# Patient Record
Sex: Female | Born: 1950 | Hispanic: Yes | Marital: Married | Smoking: Never smoker
Health system: Southern US, Community
[De-identification: ages and names within clinical notes are randomized; demographics above are authoritative.]

## PROBLEM LIST (undated history)

## (undated) DIAGNOSIS — E039 Hypothyroidism, unspecified: Secondary | ICD-10-CM

## (undated) DIAGNOSIS — Z9889 Other specified postprocedural states: Secondary | ICD-10-CM

## (undated) DIAGNOSIS — R112 Nausea with vomiting, unspecified: Secondary | ICD-10-CM

## (undated) HISTORY — PX: HEMORROIDECTOMY: SUR656

## (undated) HISTORY — PX: DILATION AND CURETTAGE OF UTERUS: SHX78

## (undated) HISTORY — PX: LIGATION / DIVISION SAPHENOUS VEIN: SUR828

---
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
## 1986-08-09 HISTORY — PX: BUNIONECTOMY: SHX129

## 1999-10-26 ENCOUNTER — Ambulatory Visit (HOSPITAL_COMMUNITY): Admission: RE | Admit: 1999-10-26 | Discharge: 1999-10-26 | Payer: Self-pay | Admitting: Family Medicine

## 1999-10-26 ENCOUNTER — Encounter: Payer: Self-pay | Admitting: Family Medicine

## 1999-10-31 ENCOUNTER — Encounter: Payer: Self-pay | Admitting: Family Medicine

## 1999-10-31 ENCOUNTER — Ambulatory Visit (HOSPITAL_COMMUNITY): Admission: RE | Admit: 1999-10-31 | Discharge: 1999-10-31 | Payer: Self-pay | Admitting: Family Medicine

## 1999-11-12 ENCOUNTER — Other Ambulatory Visit: Admission: RE | Admit: 1999-11-12 | Discharge: 1999-11-12 | Payer: Self-pay | Admitting: Obstetrics and Gynecology

## 2001-01-23 ENCOUNTER — Other Ambulatory Visit: Admission: RE | Admit: 2001-01-23 | Discharge: 2001-01-23 | Payer: Self-pay | Admitting: Obstetrics and Gynecology

## 2002-02-27 ENCOUNTER — Other Ambulatory Visit: Admission: RE | Admit: 2002-02-27 | Discharge: 2002-02-27 | Payer: Self-pay | Admitting: Obstetrics and Gynecology

## 2003-01-09 ENCOUNTER — Other Ambulatory Visit: Admission: RE | Admit: 2003-01-09 | Discharge: 2003-01-09 | Payer: Self-pay | Admitting: Obstetrics and Gynecology

## 2003-03-28 ENCOUNTER — Encounter: Admission: RE | Admit: 2003-03-28 | Discharge: 2003-03-28 | Payer: Self-pay | Admitting: Gastroenterology

## 2003-03-28 ENCOUNTER — Encounter: Payer: Self-pay | Admitting: Gastroenterology

## 2003-07-31 ENCOUNTER — Ambulatory Visit (HOSPITAL_COMMUNITY): Admission: RE | Admit: 2003-07-31 | Discharge: 2003-07-31 | Payer: Self-pay | Admitting: Gastroenterology

## 2004-05-29 ENCOUNTER — Other Ambulatory Visit: Admission: RE | Admit: 2004-05-29 | Discharge: 2004-05-29 | Payer: Self-pay | Admitting: Obstetrics and Gynecology

## 2004-06-04 ENCOUNTER — Ambulatory Visit (HOSPITAL_COMMUNITY): Admission: RE | Admit: 2004-06-04 | Discharge: 2004-06-04 | Payer: Self-pay | Admitting: Obstetrics and Gynecology

## 2005-06-10 ENCOUNTER — Other Ambulatory Visit: Admission: RE | Admit: 2005-06-10 | Discharge: 2005-06-10 | Payer: Self-pay | Admitting: Obstetrics and Gynecology

## 2008-09-03 ENCOUNTER — Encounter (INDEPENDENT_AMBULATORY_CARE_PROVIDER_SITE_OTHER): Payer: Self-pay | Admitting: General Surgery

## 2008-09-03 ENCOUNTER — Ambulatory Visit (HOSPITAL_COMMUNITY): Admission: RE | Admit: 2008-09-03 | Discharge: 2008-09-03 | Payer: Self-pay | Admitting: General Surgery

## 2008-11-11 ENCOUNTER — Encounter: Admission: RE | Admit: 2008-11-11 | Discharge: 2008-11-11 | Payer: Self-pay | Admitting: Obstetrics and Gynecology

## 2009-04-23 ENCOUNTER — Encounter: Admission: RE | Admit: 2009-04-23 | Discharge: 2009-04-23 | Payer: Self-pay | Admitting: Family Medicine

## 2009-11-11 ENCOUNTER — Encounter: Admission: RE | Admit: 2009-11-11 | Discharge: 2009-11-11 | Payer: Self-pay | Admitting: Obstetrics and Gynecology

## 2010-04-16 ENCOUNTER — Encounter: Admission: RE | Admit: 2010-04-16 | Discharge: 2010-04-16 | Payer: Self-pay | Admitting: Family Medicine

## 2010-04-22 ENCOUNTER — Encounter: Admission: RE | Admit: 2010-04-22 | Discharge: 2010-04-22 | Payer: Self-pay | Admitting: Family Medicine

## 2010-04-23 ENCOUNTER — Encounter: Admission: RE | Admit: 2010-04-23 | Discharge: 2010-04-23 | Payer: Self-pay | Admitting: Family Medicine

## 2010-08-24 ENCOUNTER — Encounter
Admission: RE | Admit: 2010-08-24 | Discharge: 2010-08-24 | Payer: Self-pay | Source: Home / Self Care | Attending: Obstetrics and Gynecology | Admitting: Obstetrics and Gynecology

## 2010-08-29 ENCOUNTER — Encounter: Payer: Self-pay | Admitting: Obstetrics and Gynecology

## 2010-08-30 ENCOUNTER — Encounter: Payer: Self-pay | Admitting: Obstetrics and Gynecology

## 2010-09-29 ENCOUNTER — Encounter (HOSPITAL_COMMUNITY)
Admission: RE | Admit: 2010-09-29 | Discharge: 2010-09-29 | Disposition: A | Discharge status: Home / Self Care | Payer: BC Managed Care – PPO | Source: Ambulatory Visit | Attending: Obstetrics and Gynecology | Admitting: Obstetrics and Gynecology

## 2010-09-29 DIAGNOSIS — Z01810 Encounter for preprocedural cardiovascular examination: Secondary | ICD-10-CM | POA: Insufficient documentation

## 2010-09-29 DIAGNOSIS — Z01812 Encounter for preprocedural laboratory examination: Secondary | ICD-10-CM | POA: Insufficient documentation

## 2010-09-29 LAB — CBC
MCH: 29.8 pg (ref 26.0–34.0)
MCHC: 34.4 g/dL (ref 30.0–36.0)
MCV: 86.7 fL (ref 78.0–100.0)
Platelets: 230 10*3/uL (ref 150–400)
RBC: 4.73 MIL/uL (ref 3.87–5.11)
RDW: 12.8 % (ref 11.5–15.5)
WBC: 7.6 10*3/uL (ref 4.0–10.5)

## 2010-10-02 ENCOUNTER — Other Ambulatory Visit: Payer: Self-pay | Admitting: Obstetrics and Gynecology

## 2010-10-02 ENCOUNTER — Ambulatory Visit (HOSPITAL_COMMUNITY)
Admission: RE | Admit: 2010-10-02 | Discharge: 2010-10-02 | Disposition: A | Discharge status: Home / Self Care | Payer: BC Managed Care – PPO | Source: Ambulatory Visit | Attending: Obstetrics and Gynecology | Admitting: Obstetrics and Gynecology

## 2010-10-02 DIAGNOSIS — N840 Polyp of corpus uteri: Secondary | ICD-10-CM | POA: Insufficient documentation

## 2010-10-02 DIAGNOSIS — N950 Postmenopausal bleeding: Secondary | ICD-10-CM | POA: Insufficient documentation

## 2010-10-02 LAB — PREGNANCY, URINE: Preg Test, Ur: NEGATIVE

## 2010-10-03 NOTE — Op Note (Signed)
  NAMEMICHAELANN, [name]              ACCOUNT NO.:  [id number]  MEDICAL RECORD NO.:  [id number]           PATIENT TYPE:  O  LOCATION:  [facility]                          [facility]  PHYSICIAN:  Duke Salvia. Marcelle Dudley, M.D.DATE OF BIRTH:  Feb 19, 1951  DATE OF PROCEDURE:  10/02/2010 DATE OF DISCHARGE:                              OPERATIVE REPORT   PREOPERATIVE DIAGNOSIS:  Leiomyoma, postmenopausal bleeding.  POSTOPERATIVE DIAGNOSIS:  Leiomyoma, postmenopausal bleeding.  PROCEDURE:  D and C hysteroscopy.  SURGEON:  Duke Salvia. Marcelle Overlie, MD  ANESTHESIA:  General.  COMPLICATIONS:  None.  DRAINS:  In-and-out Foley catheter.  BLOOD LOSS:  Minimum.  SPECIMENS REMOVED:  Endometrial curettings.  PROCEDURE AND FINDINGS:  The patient was taken to the operating room after an adequate level of general endotracheal anesthesia was obtained. With the patient's legs in stirrups, the perineum and vagina prepped and draped in usual manner for D and C.  The bladder was drained and EUA carried out.  Uterus was 14-week size.  Adnexa negative.  Speculum was positioned.  Cervix was grasped with tenaculum, was sounded to 10 cm, progressively dilated to a 27 Pratt dilator.  The continuous flow hysteroscope was inserted.  The endometrium was atrophic appearing. There were three submucous fibroids noted; one on the right lower wall, one on the left lower wall and a third at the right fundus.  There was no other atypical-appearing endometrium.  Sharp curettage was carried out along with endometrial Pipelle biopsy, minimal tissue was obtained. Portions of one fibroid were avulsed and trying to use the polyp forceps, this was sent with endometrial biopsy also.  Minimal bleeding. She tolerated this well, went to recovery room in good condition.     Allison Dudley, M.D.    RMH/MEDQ  D:  10/02/2010  T:  10/02/2010  Job:  [id number]  Electronically Signed by Richarda Dudley M.D. on 10/03/2010 04:19:46 PM

## 2010-10-03 NOTE — H&P (Signed)
  Allison Dudley, Allison Dudley              ACCOUNT NO.:  [id number]  MEDICAL RECORD NO.:  [id number]           PATIENT TYPE:  O  LOCATION:  [facility]                          [facility]  PHYSICIAN:  Duke Salvia. Marcelle Overlie, M.D.DATE OF BIRTH:  02/11/1951  DATE OF ADMISSION:  10/02/2010 DATE OF DISCHARGE:                             HISTORY & PHYSICAL   CHIEF COMPLAINT:  Postmenopausal bleeding, leiomyoma.  HISTORY OF PRESENT ILLNESS:  A 60 year old G2, P2, postmenopausal patient, not currently on HRT.  She has a long history of significant leiomyoma that has not been a problem for her in the past.  Had experienced some light postmenopausal bleeding.  An attempt made to do endometrial biopsy in the office, very difficult due to the irregular configuration of her uterus from fibroids and cervical stenosis.  She presents now for D and C hysteroscopy.  This procedure including risks related to bleeding, infection, uterine perforation that may require additional surgery all discussed with her which she understands and accepts.  She did have ultrasound done in [facility] recently, unable to evaluate the endometrium due to the large size of the leiomyoma.  PAST MEDICAL HISTORY:  Allergies:  None.  CURRENT MEDICATIONS:  Armour Thyroid.  REVIEW OF SYSTEMS:  Significant for thyroid disease.  FAMILY HISTORY:  Significant for breast cancer, hypertension, phlebitis.  SOCIAL HISTORY:  Denies alcohol, tobacco, or drug use.  Windle Guard, MD is her medical doctor.  Pap on November 05, 2009, was normal.  PHYSICAL EXAMINATION:  VITAL SIGNS:  Temperature 98.2, blood pressure 120/82. HEENT:  Unremarkable. NECK:  Supple without masses. LUNGS:  Clear. CARDIOVASCULAR:  Regular rate and rhythm without murmurs, rubs, or gallops. BREASTS:  Without masses. ABDOMEN:  Soft, flat, and nontender.  The uterus is palpable above the symphysis.  Vulva, vagina, and cervix normal.  Stenotic-appearing  cervix with some atrophic vaginal changes.  Uterus 14-week size, irregular. Adnexa negative.  IMPRESSION: 1. Leiomyoma. 2. Postmenopausal spotting.  PLAN:  D and C hysteroscopy.  Procedure and risks reviewed as above.     Valta Dillon M. Marcelle Overlie, M.D.     RMH/MEDQ  D:  10/02/2010  T:  10/02/2010  Job:  [id number]  Electronically Signed by Richarda Overlie M.D. on 10/03/2010 04:19:47 PM

## 2010-11-17 ENCOUNTER — Other Ambulatory Visit: Payer: Self-pay | Admitting: Family Medicine

## 2010-11-17 DIAGNOSIS — Z09 Encounter for follow-up examination after completed treatment for conditions other than malignant neoplasm: Secondary | ICD-10-CM

## 2010-11-23 LAB — DIFFERENTIAL
Basophils Absolute: 0.1 10*3/uL (ref 0.0–0.1)
Basophils Relative: 1 % (ref 0–1)
Eosinophils Absolute: 0.4 10*3/uL (ref 0.0–0.7)
Eosinophils Relative: 4 % (ref 0–5)
Lymphocytes Relative: 20 % (ref 12–46)
Monocytes Relative: 6 % (ref 3–12)

## 2010-11-23 LAB — COMPREHENSIVE METABOLIC PANEL
AST: 68 U/L — ABNORMAL HIGH (ref 0–37)
BUN: 9 mg/dL (ref 6–23)
CO2: 29 mEq/L (ref 19–32)
Calcium: 9.7 mg/dL (ref 8.4–10.5)
Chloride: 103 mEq/L (ref 96–112)
Glucose, Bld: 89 mg/dL (ref 70–99)
Potassium: 4.1 mEq/L (ref 3.5–5.1)
Sodium: 137 mEq/L (ref 135–145)

## 2010-11-23 LAB — CBC
RBC: 4.67 MIL/uL (ref 3.87–5.11)
RDW: 13.3 % (ref 11.5–15.5)

## 2010-12-15 ENCOUNTER — Ambulatory Visit
Admission: RE | Admit: 2010-12-15 | Discharge: 2010-12-15 | Disposition: A | Discharge status: Home / Self Care | Payer: BC Managed Care – PPO | Source: Ambulatory Visit | Attending: Family Medicine | Admitting: Family Medicine

## 2010-12-15 DIAGNOSIS — Z09 Encounter for follow-up examination after completed treatment for conditions other than malignant neoplasm: Secondary | ICD-10-CM

## 2010-12-22 NOTE — Op Note (Signed)
NAMEJOYICE, Allison Dudley              ACCOUNT NO.:  [id number]   MEDICAL RECORD NO.:  [id number]          PATIENT TYPE:  AMB   LOCATION:  [facility]                          FACILITY:  [facility]   PHYSICIAN:  Ollen Gross. Vernell Morgans, M.D. DATE OF BIRTH:  04-Jun-1951   DATE OF PROCEDURE:  09/03/2008  DATE OF DISCHARGE:  09/03/2008                               OPERATIVE REPORT   PREOPERATIVE DIAGNOSIS:  Internal and external hemorrhoids.   POSTOPERATIVE DIAGNOSIS:  Internal and external hemorrhoids.   PROCEDURE:  Internal and external hemorrhoidectomy with internal  hemorrhoid banding x2.   SURGEON:  Ollen Gross. Vernell Morgans, MD   ANESTHESIA:  General endotracheal.   PROCEDURE:  After informed consent was obtained, the patient was brought  to the operating room and placed in the supine position on operating  room table.  After adequate induction of general anesthesia, the patient  was placed in lithotomy position.  Her perirectal area was then prepped  with Betadine and draped in the usual sterile manner.  The patient's  perirectal area was then infiltrated with 0.25% Marcaine with  epinephrine and 1 mL of Wydase.  The patient was massaged gently for  several minutes.  A bullet-type retractor was then placed in the rectum  without difficulty.  The rectum was examined circumferentially.  In the  right posterior region, the patient had 1 column of significant internal  and external hemorrhoid tissue.  This was grasped with an Allis clamp  and elevated.  The hemorrhoid was scored near the base with a 15-blade  knife.  Hemostat was then placed across the base and the hemorrhoid was  excised on the top of the hemostat with tenotomy scissors.  This was  sent to pathology for further evaluation.  The incision was then closed  with a running 2-0 chromic stitch.  One figure-of-eight 2-0 chromic  stitch was used along the midportion of the suture line for hemostasis.  The last couple millimeters of the wound were left  open externally.  The  rest of the rectum was examined.  The patient had pretty minimal  external disease.  Otherwise, there were two areas, one anteriorly and  one on the left posterior position where the patient had some moderate  internal hemorrhoidal disease and each of these areas were banded  without difficulty.  Once this was accomplished, the area was completely  hemostatic and looked good.  The rectum was then filled with some  dibucaine ointment along with a small piece of Gelfoam and dibucaine  ointment was placed externally along with sterile dressings.  The  patient tolerated the procedure well.  At the end of the case, all  needle, sponge, and instrument counts were correct.  The patient was  then awakened and taken to the recovery room in stable condition.      Ollen Gross. Vernell Morgans, M.D.  Electronically Signed     PST/MEDQ  D:  09/03/2008  T:  09/03/2008  Job:  [id number]

## 2010-12-25 NOTE — Op Note (Signed)
Allison Dudley, Allison Dudley                        ACCOUNT NO.:  [id number]   MEDICAL RECORD NO.:  [id number]                   PATIENT TYPE:  AMB   LOCATION:  [facility]                                 FACILITY:  [facility]   PHYSICIAN:  Bernette Redbird, M.D.                DATE OF BIRTH:  Dec 08, 1950   DATE OF PROCEDURE:  07/31/2003  DATE OF DISCHARGE:                                 OPERATIVE REPORT   PROCEDURE PERFORMED:  Colonoscopy.   ENDOSCOPIST:  Florencia Reasons, M.D.   INDICATIONS FOR PROCEDURE:  Screening for colon cancer in a 60 year old  asymptomatic patient without risk factors.   FINDINGS:  Normal exam to the terminal ileum.   DESCRIPTION OF PROCEDURE:  The nature, purpose and risks of the procedure  had been discussed with the patient, who provided written consent.  Sedation  was fentanyl 90 mcg and Versed 9 mg IV without clinical instability.   The Olympus adjustable tension pediatric video colonoscope was quite easily  advanced around the colon to the terminal ileum which was entered for a  short distance and pullback was then performed.  The quality of the prep was  very good and it is felt that all areas were well seen.  This was a normal  examination.  No polyps, cancer, colitis, vascular malformations or  diverticulosis were noted.  Retroflexion in the rectum as well as  reinspection of the rectosigmoid was unremarkable.  No biopsies were  obtained.   The patient tolerated the procedure well and there were no apparent  complications.   IMPRESSION:  Normal screening colonoscopy.   PLAN:  Follow-up in five years for probable screening flexible  sigmoidoscopy.                                               Bernette Redbird, M.D.    RB/MEDQ  D:  07/31/2003  T:  08/01/2003  Job:  [id number]   cc:   Duke Salvia. Marcelle Overlie, M.D.  [address], [address] [place]  [place] [address]  Fax: [phone]

## 2011-09-03 ENCOUNTER — Other Ambulatory Visit: Payer: Self-pay | Admitting: Family Medicine

## 2011-09-03 DIAGNOSIS — R921 Mammographic calcification found on diagnostic imaging of breast: Secondary | ICD-10-CM

## 2011-09-28 ENCOUNTER — Ambulatory Visit
Admission: RE | Admit: 2011-09-28 | Discharge: 2011-09-28 | Disposition: A | Discharge status: Home / Self Care | Payer: BC Managed Care – PPO | Source: Ambulatory Visit | Attending: Family Medicine | Admitting: Family Medicine

## 2011-09-28 ENCOUNTER — Ambulatory Visit: Payer: BC Managed Care – PPO | Admitting: Physical Therapy

## 2011-09-28 DIAGNOSIS — R921 Mammographic calcification found on diagnostic imaging of breast: Secondary | ICD-10-CM

## 2011-09-29 ENCOUNTER — Ambulatory Visit: Payer: BC Managed Care – PPO | Admitting: Rehabilitative and Restorative Service Providers"

## 2011-09-30 ENCOUNTER — Ambulatory Visit: Payer: BC Managed Care – PPO | Admitting: Physical Therapy

## 2011-10-12 ENCOUNTER — Ambulatory Visit: Payer: BC Managed Care – PPO | Attending: Family Medicine | Admitting: Physical Therapy

## 2011-10-12 DIAGNOSIS — M6281 Muscle weakness (generalized): Secondary | ICD-10-CM | POA: Insufficient documentation

## 2011-10-12 DIAGNOSIS — M25669 Stiffness of unspecified knee, not elsewhere classified: Secondary | ICD-10-CM | POA: Insufficient documentation

## 2011-10-12 DIAGNOSIS — IMO0001 Reserved for inherently not codable concepts without codable children: Secondary | ICD-10-CM | POA: Insufficient documentation

## 2011-10-19 ENCOUNTER — Ambulatory Visit: Payer: BC Managed Care – PPO | Admitting: Rehabilitation

## 2011-10-26 ENCOUNTER — Ambulatory Visit: Payer: BC Managed Care – PPO | Admitting: Physical Therapy

## 2011-11-02 ENCOUNTER — Ambulatory Visit: Payer: BC Managed Care – PPO | Admitting: Physical Therapy

## 2011-11-09 ENCOUNTER — Encounter: Payer: BC Managed Care – PPO | Admitting: Physical Therapy

## 2011-11-16 ENCOUNTER — Encounter: Payer: BC Managed Care – PPO | Admitting: Physical Therapy

## 2011-11-23 ENCOUNTER — Ambulatory Visit: Payer: BC Managed Care – PPO | Attending: Family Medicine | Admitting: Physical Therapy

## 2011-11-23 DIAGNOSIS — M25669 Stiffness of unspecified knee, not elsewhere classified: Secondary | ICD-10-CM | POA: Insufficient documentation

## 2011-11-23 DIAGNOSIS — IMO0001 Reserved for inherently not codable concepts without codable children: Secondary | ICD-10-CM | POA: Insufficient documentation

## 2011-11-23 DIAGNOSIS — M6281 Muscle weakness (generalized): Secondary | ICD-10-CM | POA: Insufficient documentation

## 2011-11-30 ENCOUNTER — Ambulatory Visit: Payer: BC Managed Care – PPO | Admitting: Physical Therapy

## 2011-12-07 ENCOUNTER — Ambulatory Visit: Payer: BC Managed Care – PPO | Admitting: Physical Therapy

## 2011-12-14 ENCOUNTER — Encounter: Payer: BC Managed Care – PPO | Admitting: Physical Therapy

## 2011-12-14 IMAGING — MG MM DIGITAL SCREENING
4 series · 4 of 4 positions shown · non-contrast
Comparison: none

DG SCREEN MAMMOGRAM BILATERAL
Bilateral CC and MLO view(s) were taken.

DIGITAL SCREENING MAMMOGRAM WITH CAD:
The breast tissue is heterogeneously dense.  Microcalcifications are present in the left breast.  
Characterization with magnification views is recommended.  No mass or malignant type calcifications
are identified in the right breast.  Compared with prior studies.
Images were processed with CAD.

[R CC]
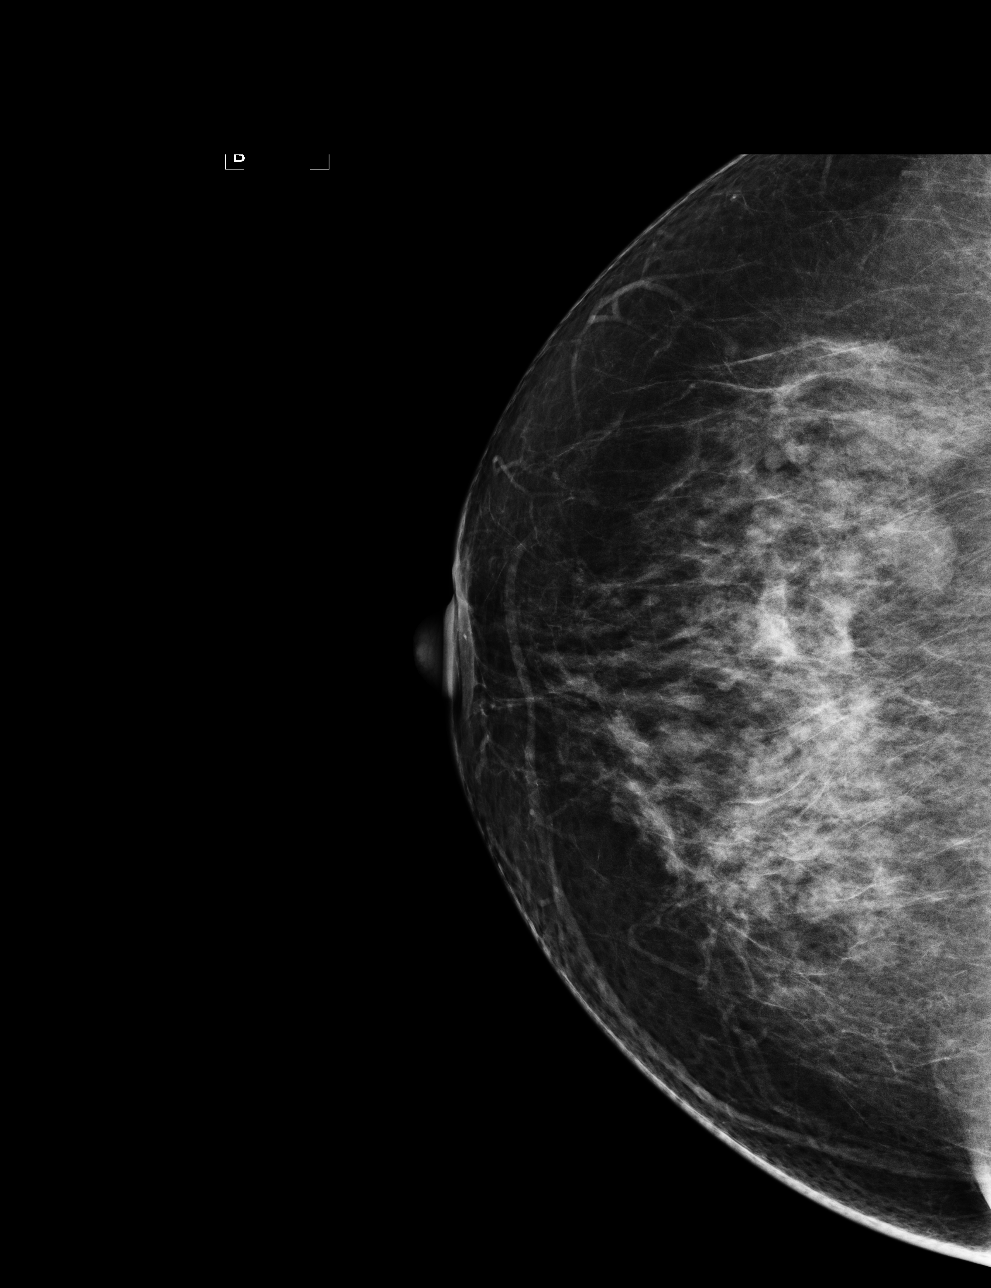

[L CC]
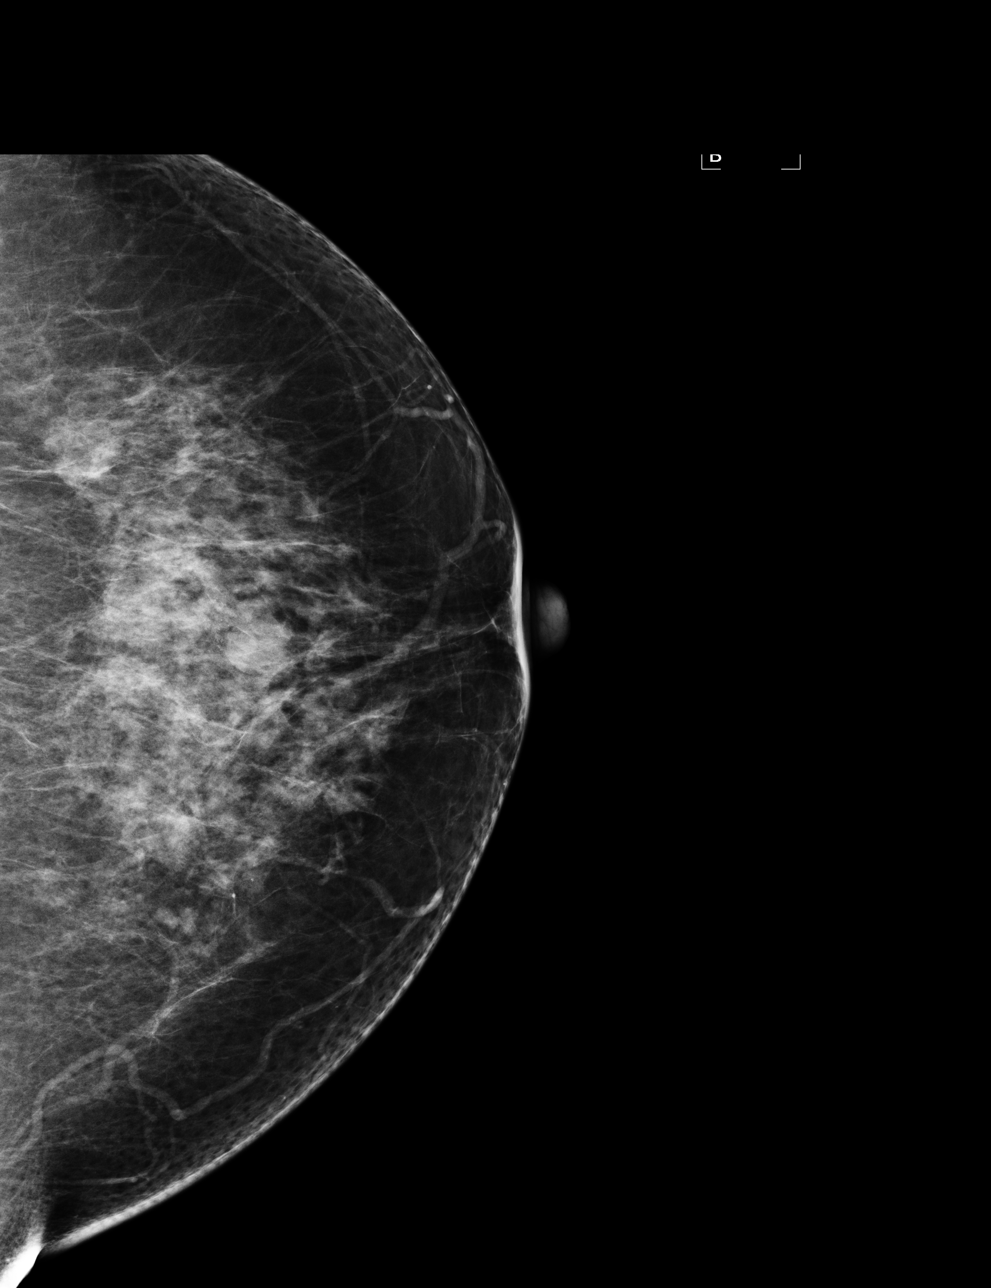

[L MLO]
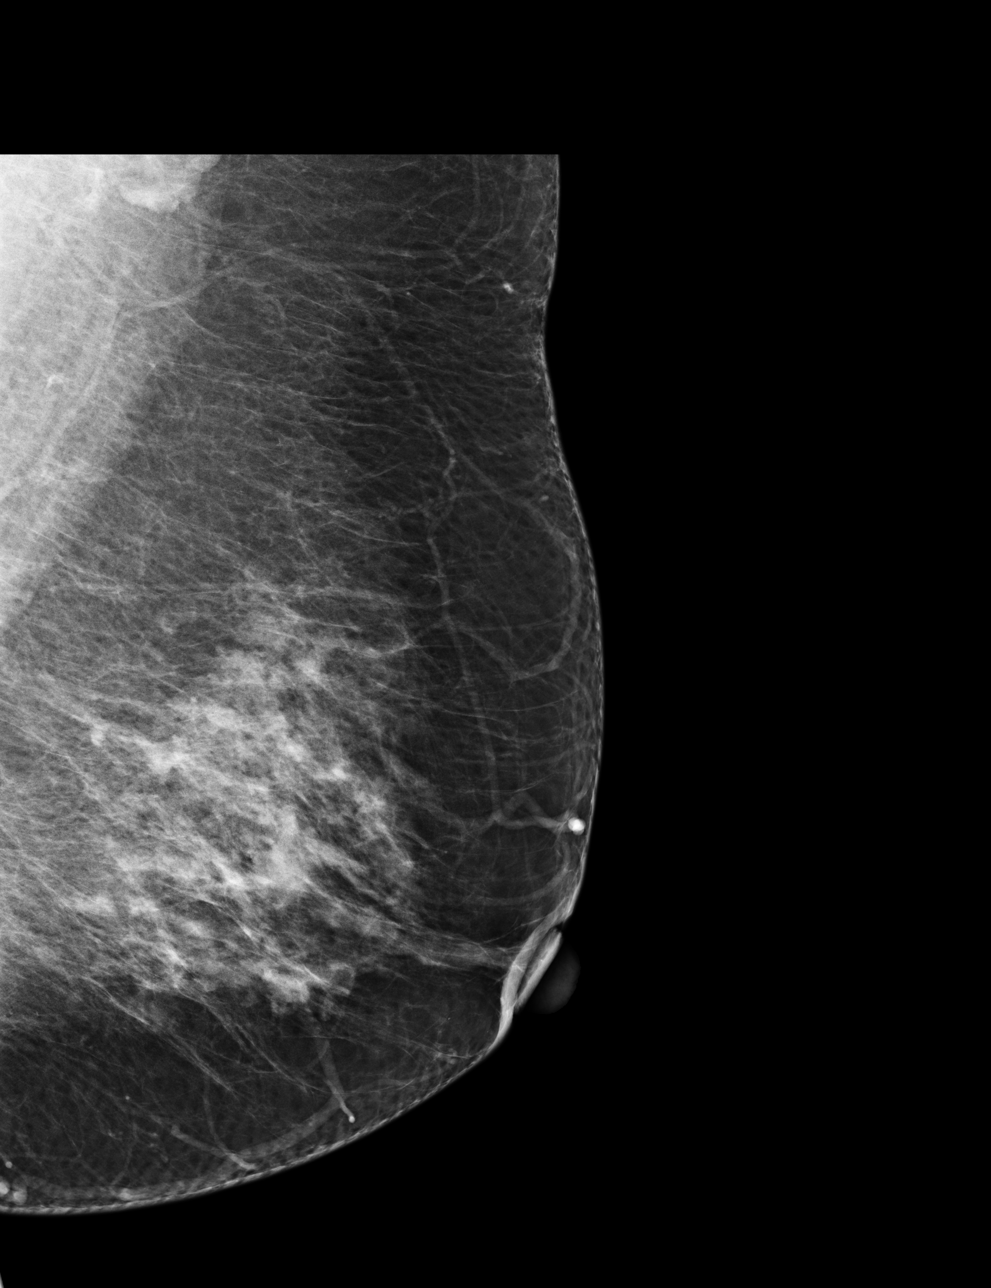

[R MLO]
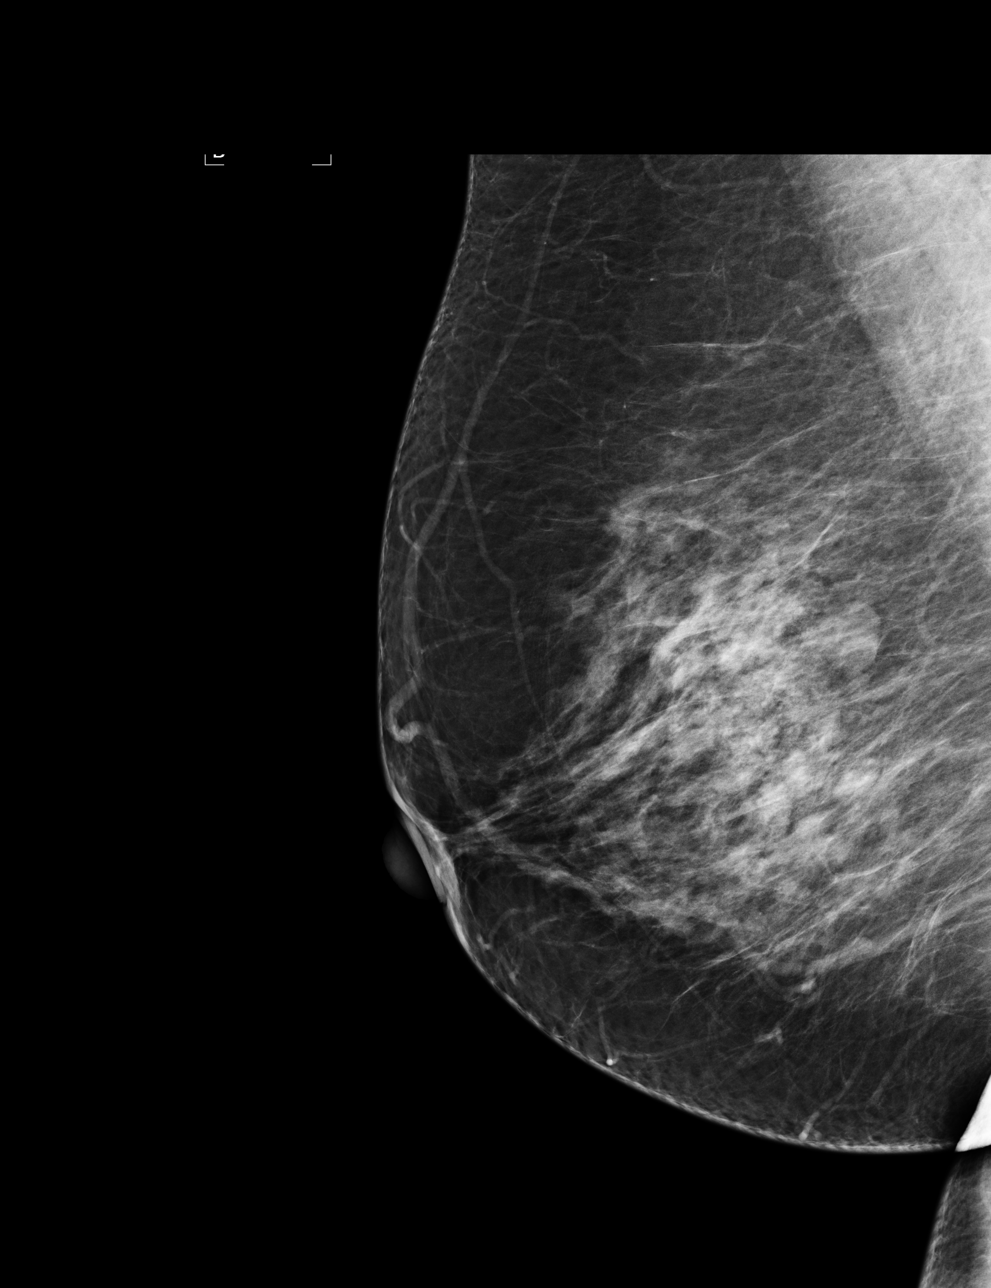

[4 of 4 positions shown; findings below may reference images not displayed]

IMPRESSION: Calcifications, left breast.  Additional evaluation is indicated. The patient will be contacted for
additional studies and a supplementary report will follow.  No specific mammographic evidence of 
malignancy, right breast.

ASSESSMENT: Need additional imaging evaluation and/or prior mammograms for comparison - BI-RADS 0

Further imaging of the left breast.
,

## 2012-01-30 ENCOUNTER — Encounter (HOSPITAL_COMMUNITY): Payer: Self-pay | Admitting: Pharmacist

## 2012-01-31 ENCOUNTER — Other Ambulatory Visit: Payer: Self-pay

## 2012-01-31 ENCOUNTER — Encounter (HOSPITAL_COMMUNITY)
Admission: RE | Admit: 2012-01-31 | Discharge: 2012-01-31 | Disposition: A | Discharge status: Home / Self Care | Payer: BC Managed Care – PPO | Source: Ambulatory Visit | Attending: Obstetrics and Gynecology | Admitting: Obstetrics and Gynecology

## 2012-01-31 ENCOUNTER — Encounter (HOSPITAL_COMMUNITY): Payer: Self-pay

## 2012-01-31 HISTORY — DX: Other specified postprocedural states: R11.2

## 2012-01-31 HISTORY — DX: Other specified postprocedural states: Z98.890

## 2012-01-31 HISTORY — DX: Hypothyroidism, unspecified: E03.9

## 2012-01-31 LAB — CBC
HCT: 41.4 % (ref 36.0–46.0)
Hemoglobin: 14.4 g/dL (ref 12.0–15.0)
MCH: 30.1 pg (ref 26.0–34.0)
MCV: 86.4 fL (ref 78.0–100.0)
Platelets: 244 10*3/uL (ref 150–400)
RBC: 4.79 MIL/uL (ref 3.87–5.11)
WBC: 8.6 10*3/uL (ref 4.0–10.5)

## 2012-01-31 LAB — SURGICAL PCR SCREEN: Staphylococcus aureus: NEGATIVE

## 2012-01-31 NOTE — H&P (Signed)
NAMETYTIONNA, CLOYD NO.:  [id number]  MEDICAL RECORD NO.:  [id number]  LOCATION:  [facility]                           [facility]  PHYSICIAN:  Duke Salvia. Marcelle Overlie, M.D.DATE OF BIRTH:  1950-08-28  DATE OF ADMISSION:  01/31/2012 DATE OF DISCHARGE:                             HISTORY & PHYSICAL   CHIEF COMPLAINT:  Pelvic pain secondary to the leiomyoma.  HISTORY OF PRESENT ILLNESS:  A 61 year old postmenopausal, G2, P2 patient who has a long history of leiomyoma.  Last year, she had some postmenopausal spotting and underwent D and C, hysteroscopy, that showed three submucous fibroids noted at that time of hysteroscopy, D and C. Specimen on October 02, 2010, showed endometrial polyps with no evidence of hyperplasia or malignancy.  She has had worsening problems with increased pain and pressure, and presents now for definitive TAHBSO.  Last ultrasound dated November 15, 2011, showed large fundal fibroid 14.3 x 9.8, second large one 10.5 x 8.6.  The procedure of TAHBSO discussed including risks related to bleeding, infection, transfusion, adjacent organ injury, wound infection, phlebitis along with her expected recovery time, which she understands and accepts.  PAST MEDICAL HISTORY:  ALLERGIES:  INHALANT ALLERGIES ONLY.  CURRENT MEDICATIONS:  Thyroid supplements, Armour Thyroid 60 mg per day.  REVIEW OF SYSTEMS:  Significant for thyroid disease.  FAMILY HISTORY:  Significant for hypertension and unspecified cancer. She has had two prior cesarean sections.  SOCIAL HISTORY:  Denies alcohol, tobacco, or drug use.  Dr. Jeannetta Nap is her medical doctor.  Last Pap dated January 20, 2012, was normal.  PHYSICAL EXAMINATION:  VITAL SIGNS:  Temp 98.2, blood pressure 130/78. HEENT:  Unremarkable. NECK:  Supple without masses. LUNGS:  Clear. CARDIOVASCULAR:  Regular rate and rhythm without murmurs, rubs, or gallops. BREASTS:  Without masses. ABDOMEN:  Soft, flat, nontender.   There are fibroids palpable below the umbilicus.  The vulva, vagina, cervix normal.  The uterus is approximately 15-16 weeks size. EXTREMITIES:  Unremarkable. NEUROLOGIC:  Unremarkable.  IMPRESSION:  Symptomatic leiomyoma, D and C, hysteroscopy last year with negative pathology.  PLAN:  TAHBSO procedure and risks discussed as above.     Brittnay Pigman M. Marcelle Overlie, M.D.     RMH/MEDQ  D:  01/31/2012  T:  01/31/2012  Job:  [id number]

## 2012-01-31 NOTE — Patient Instructions (Addendum)
YOUR PROCEDURE IS SCHEDULED ON:02/09/12  ENTER THROUGH THE MAIN ENTRANCE OF [facility] AT:0600 am USE DESK PHONE AND DIAL [phone] TO INFORM us OF YOUR ARRIVAL  CALL [phone] IF YOU HAVE ANY QUESTIONS OR PROBLEMS PRIOR TO YOUR ARRIVAL.  REMEMBER: DO NOT EAT OR DRINK AFTER MIDNIGHT : Tuesday   YOU MAY BRUSH YOUR TEETH THE MORNING OF SURGERY   TAKE THESE MEDICINES THE DAY OF SURGERY WITH SIP OF WATER: thyroid pill   DO NOT WEAR JEWELRY, EYE MAKEUP, LIPSTICK OR DARK FINGERNAIL POLISH DO NOT WEAR LOTIONS  DO NOT SHAVE FOR 48 HOURS PRIOR TO SURGERY  YOU WILL NOT BE ALLOWED TO DRIVE YOURSELF HOME.  NAME OF DRIVER: Earl Lites

## 2012-01-31 NOTE — Progress Notes (Signed)
Allison Dudley  DICTATION # [id number] CSN# [id number]   Meriel Pica, MD 01/31/2012 3:05 PM

## 2012-02-08 MED ORDER — CEFAZOLIN SODIUM-DEXTROSE 2-3 GM-% IV SOLR
2.0000 g | INTRAVENOUS | Status: AC
  Administered 2012-02-09: 2 g via INTRAVENOUS

## 2012-02-09 ENCOUNTER — Inpatient Hospital Stay (HOSPITAL_COMMUNITY)
Admission: RE | Admit: 2012-02-09 | Discharge: 2012-02-11 | DRG: 359 | Disposition: A | Discharge status: Home / Self Care | Payer: BC Managed Care – PPO | Source: Ambulatory Visit | Attending: Obstetrics and Gynecology | Admitting: Obstetrics and Gynecology

## 2012-02-09 ENCOUNTER — Encounter (HOSPITAL_COMMUNITY)
Admission: RE | Disposition: A | Discharge status: Home / Self Care | Payer: Self-pay | Source: Ambulatory Visit | Attending: Obstetrics and Gynecology

## 2012-02-09 ENCOUNTER — Encounter (HOSPITAL_COMMUNITY): Payer: Self-pay

## 2012-02-09 ENCOUNTER — Inpatient Hospital Stay (HOSPITAL_COMMUNITY): Payer: BC Managed Care – PPO

## 2012-02-09 DIAGNOSIS — N840 Polyp of corpus uteri: Secondary | ICD-10-CM | POA: Diagnosis present

## 2012-02-09 DIAGNOSIS — N8000 Endometriosis of the uterus, unspecified: Secondary | ICD-10-CM | POA: Diagnosis present

## 2012-02-09 DIAGNOSIS — D219 Benign neoplasm of connective and other soft tissue, unspecified: Secondary | ICD-10-CM

## 2012-02-09 DIAGNOSIS — D252 Subserosal leiomyoma of uterus: Principal | ICD-10-CM | POA: Diagnosis present

## 2012-02-09 HISTORY — PX: ABDOMINAL HYSTERECTOMY: SHX81

## 2012-02-09 HISTORY — PX: SALPINGOOPHORECTOMY: SHX82

## 2012-02-09 SURGERY — HYSTERECTOMY, ABDOMINAL
Anesthesia: General | Site: Abdomen | Wound class: Clean Contaminated

## 2012-02-09 MED ORDER — ATROPINE SULFATE 0.4 MG/ML IJ SOLN
INTRAMUSCULAR | Status: AC
  Filled 2012-02-09: qty 1

## 2012-02-09 MED ORDER — CEFAZOLIN SODIUM-DEXTROSE 2-3 GM-% IV SOLR
INTRAVENOUS | Status: AC
  Filled 2012-02-09: qty 50

## 2012-02-09 MED ORDER — MORPHINE SULFATE (PF) 1 MG/ML IV SOLN
INTRAVENOUS | Status: DC
  Administered 2012-02-09: 10.58 mg via INTRAVENOUS
  Administered 2012-02-09: 10 mg via INTRAVENOUS
  Administered 2012-02-09 (×2): via INTRAVENOUS
  Administered 2012-02-09: 5 mg via INTRAVENOUS
  Administered 2012-02-10: 2 mg via INTRAVENOUS
  Administered 2012-02-10: 13 mg via INTRAVENOUS
  Filled 2012-02-09 (×2): qty 25

## 2012-02-09 MED ORDER — PROPOFOL 10 MG/ML IV EMUL
INTRAVENOUS | Status: DC | PRN
  Administered 2012-02-09: 200 mg via INTRAVENOUS

## 2012-02-09 MED ORDER — DEXAMETHASONE SODIUM PHOSPHATE 10 MG/ML IJ SOLN
INTRAMUSCULAR | Status: AC
  Filled 2012-02-09: qty 1

## 2012-02-09 MED ORDER — LACTATED RINGERS IV SOLN
INTRAVENOUS | Status: DC
  Administered 2012-02-09 (×2): via INTRAVENOUS

## 2012-02-09 MED ORDER — KETOROLAC TROMETHAMINE 30 MG/ML IJ SOLN
30.0000 mg | Freq: Once | INTRAMUSCULAR | Status: DC
  Filled 2012-02-09 (×3): qty 1

## 2012-02-09 MED ORDER — OXYCODONE-ACETAMINOPHEN 5-325 MG PO TABS
1.0000 | ORAL_TABLET | ORAL | Status: DC | PRN
  Administered 2012-02-10 (×3): 2 via ORAL
  Administered 2012-02-11 (×2): 1 via ORAL
  Filled 2012-02-09 (×2): qty 2
  Filled 2012-02-09: qty 1
  Filled 2012-02-09: qty 2
  Filled 2012-02-09: qty 1

## 2012-02-09 MED ORDER — KETOROLAC TROMETHAMINE 30 MG/ML IJ SOLN: 30.0000 mg | Freq: Four times a day (QID) | INTRAMUSCULAR | Status: DC

## 2012-02-09 MED ORDER — BUPIVACAINE HCL (PF) 0.5 % IJ SOLN
INTRAMUSCULAR | Status: AC
  Filled 2012-02-09: qty 60

## 2012-02-09 MED ORDER — SODIUM CHLORIDE 0.9 % IJ SOLN: 9.0000 mL | INTRAMUSCULAR | Status: DC | PRN

## 2012-02-09 MED ORDER — KETOROLAC TROMETHAMINE 30 MG/ML IJ SOLN
30.0000 mg | Freq: Four times a day (QID) | INTRAMUSCULAR | Status: DC
Start: 2012-02-09 — End: 2012-02-10
  Administered 2012-02-09 – 2012-02-10 (×4): 30 mg via INTRAVENOUS
  Filled 2012-02-09 (×2): qty 1

## 2012-02-09 MED ORDER — SCOPOLAMINE 1 MG/3DAYS TD PT72
1.0000 | MEDICATED_PATCH | TRANSDERMAL | Status: DC
  Administered 2012-02-09: 1.5 mg via TRANSDERMAL

## 2012-02-09 MED ORDER — ROCURONIUM BROMIDE 50 MG/5ML IV SOLN
INTRAVENOUS | Status: AC
  Filled 2012-02-09: qty 1

## 2012-02-09 MED ORDER — BUTORPHANOL TARTRATE 2 MG/ML IJ SOLN: 1.0000 mg | INTRAMUSCULAR | Status: DC | PRN

## 2012-02-09 MED ORDER — LIDOCAINE HCL (CARDIAC) 20 MG/ML IV SOLN
INTRAVENOUS | Status: DC | PRN
  Administered 2012-02-09: 30 mg via INTRAVENOUS

## 2012-02-09 MED ORDER — ONDANSETRON HCL 4 MG/2ML IJ SOLN
INTRAMUSCULAR | Status: AC
  Filled 2012-02-09: qty 2

## 2012-02-09 MED ORDER — MIDAZOLAM HCL 2 MG/2ML IJ SOLN
INTRAMUSCULAR | Status: AC
  Filled 2012-02-09: qty 2

## 2012-02-09 MED ORDER — BUPIVACAINE HCL (PF) 0.5 % IJ SOLN
INTRAMUSCULAR | Status: AC
Start: 2012-02-09 — End: 2012-02-09
  Filled 2012-02-09: qty 60

## 2012-02-09 MED ORDER — MIDAZOLAM HCL 5 MG/5ML IJ SOLN
INTRAMUSCULAR | Status: DC | PRN
  Administered 2012-02-09: 2 mg via INTRAVENOUS

## 2012-02-09 MED ORDER — HYDROMORPHONE HCL PF 1 MG/ML IJ SOLN
0.2500 mg | INTRAMUSCULAR | Status: DC | PRN
  Administered 2012-02-09 (×2): 0.5 mg via INTRAVENOUS

## 2012-02-09 MED ORDER — MENTHOL 3 MG MT LOZG: 1.0000 | LOZENGE | OROMUCOSAL | Status: DC | PRN

## 2012-02-09 MED ORDER — HYDROMORPHONE HCL PF 1 MG/ML IJ SOLN
INTRAMUSCULAR | Status: AC
  Administered 2012-02-09: 0.5 mg via INTRAVENOUS
  Filled 2012-02-09: qty 1

## 2012-02-09 MED ORDER — NALOXONE HCL 0.4 MG/ML IJ SOLN: 0.4000 mg | INTRAMUSCULAR | Status: DC | PRN

## 2012-02-09 MED ORDER — NEOSTIGMINE METHYLSULFATE 1 MG/ML IJ SOLN
INTRAMUSCULAR | Status: DC | PRN
  Administered 2012-02-09: 5 mg via INTRAVENOUS

## 2012-02-09 MED ORDER — KETOROLAC TROMETHAMINE 30 MG/ML IJ SOLN
INTRAMUSCULAR | Status: AC
  Filled 2012-02-09: qty 1

## 2012-02-09 MED ORDER — LIDOCAINE HCL (CARDIAC) 20 MG/ML IV SOLN
INTRAVENOUS | Status: AC
  Filled 2012-02-09: qty 5

## 2012-02-09 MED ORDER — ZOLPIDEM TARTRATE 5 MG PO TABS: 5.0000 mg | ORAL_TABLET | Freq: Every evening | ORAL | Status: DC | PRN

## 2012-02-09 MED ORDER — EPHEDRINE 5 MG/ML INJ
INTRAVENOUS | Status: AC
  Filled 2012-02-09: qty 10

## 2012-02-09 MED ORDER — ONDANSETRON HCL 4 MG/2ML IJ SOLN: 4.0000 mg | Freq: Four times a day (QID) | INTRAMUSCULAR | Status: DC | PRN

## 2012-02-09 MED ORDER — FENTANYL CITRATE 0.05 MG/ML IJ SOLN
INTRAMUSCULAR | Status: DC | PRN
  Administered 2012-02-09 (×2): 50 ug via INTRAVENOUS
  Administered 2012-02-09: 100 ug via INTRAVENOUS
  Administered 2012-02-09 (×3): 50 ug via INTRAVENOUS

## 2012-02-09 MED ORDER — DIPHENHYDRAMINE HCL 12.5 MG/5ML PO ELIX: 12.5000 mg | ORAL_SOLUTION | Freq: Four times a day (QID) | ORAL | Status: DC | PRN

## 2012-02-09 MED ORDER — PROPOFOL 10 MG/ML IV EMUL
INTRAVENOUS | Status: AC
  Filled 2012-02-09: qty 20

## 2012-02-09 MED ORDER — NEOSTIGMINE METHYLSULFATE 1 MG/ML IJ SOLN
INTRAMUSCULAR | Status: AC
  Filled 2012-02-09: qty 10

## 2012-02-09 MED ORDER — GLYCOPYRROLATE 0.2 MG/ML IJ SOLN
INTRAMUSCULAR | Status: AC
  Filled 2012-02-09: qty 3

## 2012-02-09 MED ORDER — FENTANYL CITRATE 0.05 MG/ML IJ SOLN
INTRAMUSCULAR | Status: AC
  Filled 2012-02-09: qty 5

## 2012-02-09 MED ORDER — ONDANSETRON HCL 4 MG PO TABS: 4.0000 mg | ORAL_TABLET | Freq: Four times a day (QID) | ORAL | Status: DC | PRN

## 2012-02-09 MED ORDER — ATROPINE SULFATE 0.4 MG/ML IJ SOLN
INTRAMUSCULAR | Status: DC | PRN
  Administered 2012-02-09: 0.2 mg via INTRAVENOUS

## 2012-02-09 MED ORDER — LIDOCAINE HCL 1 % IJ SOLN
INTRAMUSCULAR | Status: DC | PRN
  Administered 2012-02-09: 10 mL

## 2012-02-09 MED ORDER — IBUPROFEN 800 MG PO TABS
800.0000 mg | ORAL_TABLET | Freq: Three times a day (TID) | ORAL | Status: DC | PRN
  Administered 2012-02-10 – 2012-02-11 (×3): 800 mg via ORAL
  Filled 2012-02-09 (×4): qty 1

## 2012-02-09 MED ORDER — EPHEDRINE SULFATE 50 MG/ML IJ SOLN
INTRAMUSCULAR | Status: DC | PRN
  Administered 2012-02-09: 20 mg via INTRAVENOUS

## 2012-02-09 MED ORDER — METHYLENE BLUE 1 % INJ SOLN
INTRAMUSCULAR | Status: AC
  Filled 2012-02-09: qty 1

## 2012-02-09 MED ORDER — METOCLOPRAMIDE HCL 5 MG/ML IJ SOLN: 10.0000 mg | Freq: Once | INTRAMUSCULAR | Status: DC | PRN

## 2012-02-09 MED ORDER — KETOROLAC TROMETHAMINE 30 MG/ML IJ SOLN
INTRAMUSCULAR | Status: DC | PRN
  Administered 2012-02-09: 30 mg via INTRAVENOUS

## 2012-02-09 MED ORDER — ROCURONIUM BROMIDE 100 MG/10ML IV SOLN
INTRAVENOUS | Status: DC | PRN
  Administered 2012-02-09: 40 mg via INTRAVENOUS
  Administered 2012-02-09: 10 mg via INTRAVENOUS

## 2012-02-09 MED ORDER — DEXAMETHASONE SODIUM PHOSPHATE 4 MG/ML IJ SOLN
INTRAMUSCULAR | Status: DC | PRN
  Administered 2012-02-09: 10 mg via INTRAVENOUS

## 2012-02-09 MED ORDER — BUPIVACAINE HCL (PF) 0.5 % IJ SOLN
INTRAMUSCULAR | Status: DC | PRN
  Administered 2012-02-09: 270 mL

## 2012-02-09 MED ORDER — ONDANSETRON HCL 4 MG/2ML IJ SOLN
INTRAMUSCULAR | Status: DC | PRN
  Administered 2012-02-09: 4 mg via INTRAVENOUS

## 2012-02-09 MED ORDER — DIPHENHYDRAMINE HCL 50 MG/ML IJ SOLN: 12.5000 mg | Freq: Four times a day (QID) | INTRAMUSCULAR | Status: DC | PRN

## 2012-02-09 MED ORDER — GLYCOPYRROLATE 0.2 MG/ML IJ SOLN
INTRAMUSCULAR | Status: AC
  Filled 2012-02-09: qty 1

## 2012-02-09 MED ORDER — LACTATED RINGERS IV SOLN: INTRAVENOUS | Status: DC

## 2012-02-09 MED ORDER — SCOPOLAMINE 1 MG/3DAYS TD PT72
MEDICATED_PATCH | TRANSDERMAL | Status: AC
  Administered 2012-02-09: 1.5 mg via TRANSDERMAL
  Filled 2012-02-09: qty 1

## 2012-02-09 MED ORDER — FENTANYL CITRATE 0.05 MG/ML IJ SOLN
INTRAMUSCULAR | Status: AC
  Filled 2012-02-09: qty 2

## 2012-02-09 MED ORDER — BUPIVACAINE HCL (PF) 0.5 % IJ SOLN
INTRAMUSCULAR | Status: AC
  Filled 2012-02-09: qty 30

## 2012-02-09 MED ORDER — MEPERIDINE HCL 25 MG/ML IJ SOLN: 6.2500 mg | INTRAMUSCULAR | Status: DC | PRN

## 2012-02-09 MED ORDER — GLYCOPYRROLATE 0.2 MG/ML IJ SOLN
INTRAMUSCULAR | Status: DC | PRN
  Administered 2012-02-09 (×2): 0.2 mg via INTRAVENOUS
  Administered 2012-02-09: 1 mg via INTRAVENOUS

## 2012-02-09 SURGICAL SUPPLY — 41 items
BARRIER ADHS 3X4 INTERCEED (GAUZE/BANDAGES/DRESSINGS) IMPLANT
BRR ADH 4X3 ABS CNTRL BYND (GAUZE/BANDAGES/DRESSINGS)
CANISTER SUCTION 2500CC (MISCELLANEOUS) ×3 IMPLANT
CATH KIT ON Q 5IN DUAL SLV (PAIN MANAGEMENT) ×2 IMPLANT
CLOTH BEACON ORANGE TIMEOUT ST (SAFETY) ×3 IMPLANT
CONT PATH 16OZ SNAP LID 3702 (MISCELLANEOUS) ×3 IMPLANT
DECANTER SPIKE VIAL GLASS SM (MISCELLANEOUS) ×1 IMPLANT
DRESSING TELFA 8X3 (GAUZE/BANDAGES/DRESSINGS) ×1 IMPLANT
DRSG COVADERM 4X10 (GAUZE/BANDAGES/DRESSINGS) ×1 IMPLANT
DRSG TEGADERM 2.38X2.75 (GAUZE/BANDAGES/DRESSINGS) IMPLANT
ELECT LIGASURE SHORT 9 REUSE (ELECTRODE) ×1 IMPLANT
GLOVE BIO SURGEON STRL SZ7 (GLOVE) ×6 IMPLANT
GOWN PREVENTION PLUS LG XLONG (DISPOSABLE) ×9 IMPLANT
NDL HYPO 25X1 1.5 SAFETY (NEEDLE) IMPLANT
NEEDLE HYPO 25X1 1.5 SAFETY (NEEDLE) IMPLANT
NS IRRIG 1000ML POUR BTL (IV SOLUTION) ×3 IMPLANT
PACK ABDOMINAL GYN (CUSTOM PROCEDURE TRAY) ×3 IMPLANT
PAD OB MATERNITY 4.3X12.25 (PERSONAL CARE ITEMS) ×3 IMPLANT
PROTECTOR NERVE ULNAR (MISCELLANEOUS) ×3 IMPLANT
SPONGE GAUZE 4X4 12PLY (GAUZE/BANDAGES/DRESSINGS) ×1 IMPLANT
SPONGE LAP 18X18 X RAY DECT (DISPOSABLE) ×6 IMPLANT
STRIP CLOSURE SKIN 1/4X4 (GAUZE/BANDAGES/DRESSINGS) ×3 IMPLANT
SUT CHROMIC 3 0 SH 27 (SUTURE) IMPLANT
SUT MON AB 2-0 CT1 36 (SUTURE) ×3 IMPLANT
SUT MON AB 4-0 PS1 27 (SUTURE) ×3 IMPLANT
SUT PDS AB 0 CT1 27 (SUTURE) ×6 IMPLANT
SUT PDS AB 1 CT  36 (SUTURE)
SUT PDS AB 1 CT 36 (SUTURE) IMPLANT
SUT VIC AB 0 CT1 18XCR BRD8 (SUTURE) ×4 IMPLANT
SUT VIC AB 0 CT1 27 (SUTURE) ×3
SUT VIC AB 0 CT1 27XBRD ANBCTR (SUTURE) ×6 IMPLANT
SUT VIC AB 0 CT1 8-18 (SUTURE) ×6
SUT VIC AB 2-0 CT1 27 (SUTURE)
SUT VIC AB 2-0 CT1 TAPERPNT 27 (SUTURE) IMPLANT
SUT VIC AB 3-0 CT1 27 (SUTURE) ×3
SUT VIC AB 3-0 CT1 TAPERPNT 27 (SUTURE) ×4 IMPLANT
SUT VICRYL 0 TIES 12 18 (SUTURE) ×3 IMPLANT
SYR CONTROL 10ML LL (SYRINGE) IMPLANT
TOWEL OR 17X24 6PK STRL BLUE (TOWEL DISPOSABLE) ×6 IMPLANT
TRAY FOLEY CATH 14FR (SET/KITS/TRAYS/PACK) ×3 IMPLANT
WATER STERILE IRR 1000ML POUR (IV SOLUTION) ×3 IMPLANT

## 2012-02-09 NOTE — Transfer of Care (Signed)
Immediate Anesthesia Transfer of Care Note  Patient: Allison Dudley  Procedure(s) Performed: Procedure(s) (LRB): HYSTERECTOMY ABDOMINAL (N/A) SALPINGO OOPHERECTOMY (Bilateral)  Patient Location: PACU  Anesthesia Type: General  Level of Consciousness: awake, alert  and oriented  Airway & Oxygen Therapy: Patient Spontanous Breathing and Patient connected to nasal cannula oxygen  Post-op Assessment: Report given to PACU RN, Post -op Vital signs reviewed and stable and Patient moving all extremities X 4  Post vital signs: Reviewed and stable  Complications: No apparent anesthesia complications

## 2012-02-09 NOTE — Progress Notes (Signed)
The patient was re-examined with no change in status 

## 2012-02-09 NOTE — Anesthesia Procedure Notes (Signed)
Procedure Name: Intubation Date/Time: 02/09/2012 7:35 AM Performed by: Lincoln Brigham Pre-anesthesia Checklist: Patient identified, Timeout performed, Emergency Drugs available, Suction available and Patient being monitored Patient Re-evaluated:Patient Re-evaluated prior to inductionOxygen Delivery Method: Circle system utilized Preoxygenation: Pre-oxygenation with 100% oxygen Intubation Type: IV induction Ventilation: Mask ventilation without difficulty Grade View: Grade IV Tube type: Oral Tube size: 7.0 mm Number of attempts: 4 Airway Equipment and Method: Patient positioned with wedge pillow,  Stylet and Video-laryngoscopy Placement Confirmation: ETT inserted through vocal cords under direct vision,  positive ETCO2 and breath sounds checked- equal and bilateral Secured at: 20 cm Tube secured with: Tape Dental Injury: Teeth and Oropharynx as per pre-operative assessment  Difficulty Due To: Difficulty was unanticipated and Difficult Airway- due to anterior larynx Future Recommendations: Recommend- induction with short-acting agent, and alternative techniques readily available Comments: Attempted Dl x 2 by CRNA No cords seen.  Glide Scope used by CRNA cords in visual field unable to pass ETT. Dr. Malen Gauze MDA attempted and passed ETT through cords via the Glide Scope.

## 2012-02-09 NOTE — Op Note (Signed)
Preoperative diagnosis: Symptomatic leiomyoma  Postoperative diagnosis: Same  Procedure: TAH BSO  Surgeon: Marcelle Overlie  assistant: Henderson Cloud  Anesthesia: Gen. endotracheal  EBL: 350 cc  Drains: Foley catheter  Specimens removed: Uterus, bilateral tubes and ovaries, to pathology   Procedure and findings:  The patient was taken the operating room after an adequate level of general endotracheal anesthesia was obtained with the patient supine the abdomen perineum and vagina were prepped with Betadine and a Foley catheter was positioned. Appropriate timeout for taken at that point.   Transverse Pfannenstiel incision was made through her old C-section scar carried down to the fascia which was incised and extended transversely. Rectus muscle divided in the midline peritoneum entered superiorly without incident and extended in a vertical fashion. The uterus approximately 16 weeks size was elevated through the incision, bilateral tubes and ovaries in the cul-de-sac free and clear there was moderate scarring anteriorly along the bladder flap starting on the left the round ligament was coagulated and divided the peritoneum carried around to the midline the exact same repeated on the opposite side, then with sharp and blunt dissection the bladder was dissected below. On the left the left IP ligament was isolated, course of the ureter was noted be well below, this was then coagulated and divided the exact same repeated on the opposite side, again after identifying the ureter well below. Once this was completed the uterine vasculature pedicles were skeletonized, coagulated and divided with the LigaSure device. After assuring that the bladder was well below staying close to the uterus the cardinal ligament was clamped divided and suture ligated along the uterosacral ligament and thigh the cervical-vaginal pedicles and the specimen was removed. Vaginal cuff was closed with several interrupted 2-0 Vicryl sutures. The  pelvis is in irrigated the operative site was inspected all pedicles and noted be hemostatic cecum and appendix were normal prior to closure sponge denies precast reported as correct x2 peritoneum closed with 2-0 Vicryl suture, 2-0 Vicryl interrupted sutures used to reapproximate the rectus muscles in the midline. 0 PDS suture was then used from laterally to midline on either side. Prior to closing the fascia a separate six-inch needle introducer was then used to introduce the subfascial On-Q catheter a separate incision made for the subcutaneous On-Q catheter. Once the fascia was closed the subcutaneous tissue was irrigated noted be hemostatic a 4-0 Monocryl subcuticular closure with a pressure dressing and the On-Q catheters PROM with 4-5 cc of 1% Xylocaine at each site. She tolerated this well, clear urine noted at the end of the case went to PACU in good condition  Dictated with [facility]  Allison Dudley M. Milana Obey.D.

## 2012-02-09 NOTE — Anesthesia Postprocedure Evaluation (Signed)
  Anesthesia Post-op Note  Patient: Allison Dudley  Procedure(s) Performed: Procedure(s) (LRB): HYSTERECTOMY ABDOMINAL (N/A) SALPINGO OOPHERECTOMY (Bilateral)  Patient Location: PACU  Anesthesia Type: General  Level of Consciousness: awake, alert  and oriented  Airway and Oxygen Therapy: Patient Spontanous Breathing  Post-op Pain: mild  Post-op Assessment: Post-op Vital signs reviewed, Patient's Cardiovascular Status Stable, Respiratory Function Stable, Patent Airway, No signs of Nausea or vomiting and Pain level controlled  Post-op Vital Signs: Reviewed and stable  Complications: No apparent anesthesia complications

## 2012-02-09 NOTE — Anesthesia Preprocedure Evaluation (Signed)
Anesthesia Evaluation  Patient identified by MRN, date of birth, ID band Patient awake    Reviewed: Allergy & Precautions, H&P , NPO status , Patient's Chart, lab work & pertinent test results  History of Anesthesia Complications (+) PONV  Airway Mallampati: III TM Distance: >3 FB Neck ROM: full    Dental  (+) Teeth Intact   Pulmonary neg pulmonary ROS,  breath sounds clear to auscultation        Cardiovascular negative cardio ROS  Rhythm:regular Rate:Normal     Neuro/Psych negative neurological ROS  negative psych ROS   GI/Hepatic negative GI ROS, Neg liver ROS,   Endo/Other  Hypothyroidism  Renal/GU negative Renal ROS  negative genitourinary   Musculoskeletal   Abdominal   Peds  Hematology negative hematology ROS (+)   Anesthesia Other Findings   Reproductive/Obstetrics negative OB ROS                           Anesthesia Physical Anesthesia Plan  ASA: II  Anesthesia Plan: General ETT   Post-op Pain Management:    Induction:   Airway Management Planned:   Additional Equipment:   Intra-op Plan:   Post-operative Plan:   Informed Consent: I have reviewed the patients History and Physical, chart, labs and discussed the procedure including the risks, benefits and alternatives for the proposed anesthesia with the patient or authorized representative who has indicated his/her understanding and acceptance.   Dental Advisory Given  Plan Discussed with: Anesthesiologist, CRNA and Surgeon  Anesthesia Plan Comments:         Anesthesia Quick Evaluation

## 2012-02-10 ENCOUNTER — Encounter (HOSPITAL_COMMUNITY): Payer: Self-pay | Admitting: Obstetrics and Gynecology

## 2012-02-10 LAB — CBC
HCT: 32.7 % — ABNORMAL LOW (ref 36.0–46.0)
Hemoglobin: 11.1 g/dL — ABNORMAL LOW (ref 12.0–15.0)
MCH: 29.8 pg (ref 26.0–34.0)
MCHC: 33.9 g/dL (ref 30.0–36.0)
MCV: 87.7 fL (ref 78.0–100.0)
RDW: 13.1 % (ref 11.5–15.5)

## 2012-02-10 NOTE — Progress Notes (Signed)
1 Day Post-Op Procedure(s) (LRB): HYSTERECTOMY ABDOMINAL (N/A) SALPINGO OOPHERECTOMY (Bilateral)  Subjective: Patient reports tolerating PO.    Objective: I have reviewed patient's vital signs, intake and output and labs.  Abd soft, + BS, Inc C/D CBC    Component Value Date/Time   WBC 17.5* 02/10/2012 0610   RBC 3.73* 02/10/2012 0610   HGB 11.1* 02/10/2012 0610   HCT 32.7* 02/10/2012 0610   PLT 231 02/10/2012 0610   MCV 87.7 02/10/2012 0610   MCH 29.8 02/10/2012 0610   MCHC 33.9 02/10/2012 0610   RDW 13.1 02/10/2012 0610   LYMPHSABS 2.3 08/29/2008 1144   MONOABS 0.7 08/29/2008 1144   EOSABS 0.4 08/29/2008 1144   BASOSABS 0.1 08/29/2008 1144      Assessment: s/p Procedure(s) (LRB): HYSTERECTOMY ABDOMINAL (N/A) SALPINGO OOPHERECTOMY (Bilateral): stable  Plan: Advance diet Encourage ambulation Discontinue IV fluids  LOS: 1 day    Allison Dudley M 02/10/2012, 9:15 AM

## 2012-02-10 NOTE — Addendum Note (Signed)
Addendum  created 02/10/12 1005 by Lincoln Brigham, CRNA   Modules edited:Notes Section

## 2012-02-10 NOTE — Anesthesia Postprocedure Evaluation (Signed)
  Anesthesia Post-op Note  Patient: Allison Dudley  Procedure(s) Performed: Procedure(s) (LRB): HYSTERECTOMY ABDOMINAL (N/A) SALPINGO OOPHERECTOMY (Bilateral)  Patient Location: [facility]  Anesthesia Type: General  Level of Consciousness: awake, alert  and oriented  Airway and Oxygen Therapy: Patient Spontanous Breathing  Post-op Pain: mild  Post-op Assessment: Patient's Cardiovascular Status Stable, Respiratory Function Stable, Patent Airway, No signs of Nausea or vomiting, Adequate PO intake and Pain level controlled  Post-op Vital Signs: stable  Complications: No apparent anesthesia complications

## 2012-02-10 NOTE — Progress Notes (Signed)
UR Chart review completed.  

## 2012-02-11 MED ORDER — IBUPROFEN 800 MG PO TABS: 800.0000 mg | ORAL_TABLET | Freq: Three times a day (TID) | ORAL | Status: AC | PRN

## 2012-02-11 MED ORDER — OXYCODONE-ACETAMINOPHEN 5-325 MG PO TABS: 1.0000 | ORAL_TABLET | ORAL | Status: AC | PRN

## 2012-02-11 NOTE — Progress Notes (Signed)
2 Days Post-Op Procedure(s) (LRB): HYSTERECTOMY ABDOMINAL (N/A) SALPINGO OOPHERECTOMY (Bilateral)  Subjective: Patient reports tolerating PO.    Objective: I have reviewed patient's vital signs, intake and output, medications and labs.  Inc C/D, some inferior bruising, ON Q cath out intact X 2  Assessment: s/p Procedure(s) (LRB): HYSTERECTOMY ABDOMINAL (N/A) SALPINGO OOPHERECTOMY (Bilateral): stable  Plan: Discharge home  LOS: 2 days    Aireal Slater M 02/11/2012, 8:07 AM

## 2012-02-11 NOTE — Discharge Summary (Signed)
Physician Discharge Summary  Patient ID: Allison Dudley MRN: [id number] DOB/AGE: 1950/12/05 61 y.o.  Admit date: 02/09/2012 Discharge date: 02/11/2012  Admission Diagnoses:  Discharge Diagnoses:  Active Problems:  Leiomyoma   Discharged Condition: good  Hospital Course: TAHBSO, diet adv POD#1, on POD #2, ON Q cath out X2, pt afeb , Inc C/D, tol PO and ready for D/C  Consults: None  Significant Diagnostic Studies: labs: CBC    Component Value Date/Time   WBC 17.5* 02/10/2012 0610   RBC 3.73* 02/10/2012 0610   HGB 11.1* 02/10/2012 0610   HCT 32.7* 02/10/2012 0610   PLT 231 02/10/2012 0610   MCV 87.7 02/10/2012 0610   MCH 29.8 02/10/2012 0610   MCHC 33.9 02/10/2012 0610   RDW 13.1 02/10/2012 0610   LYMPHSABS 2.3 08/29/2008 1144   MONOABS 0.7 08/29/2008 1144   EOSABS 0.4 08/29/2008 1144   BASOSABS 0.1 08/29/2008 1144      Treatments: surgery: TAHBSO  Discharge Exam: Blood pressure 122/74, pulse 68, temperature 98.2 F (36.8 C), temperature source Oral, resp. rate 18, height 5\' 5"  (1.651 m), weight 194 lb (87.998 kg), SpO2 95.00%. Abd soft + BS, Inc C/D  Disposition: 01-Home or Self Care   Medication List  As of 02/11/2012  8:10 AM   TAKE these medications         ibuprofen 800 MG tablet   Commonly known as: ADVIL,MOTRIN   Take 1 tablet (800 mg total) by mouth every 8 (eight) hours as needed (mild pain).      multivitamin with minerals Tabs   Take 1 tablet by mouth daily.      oxyCODONE-acetaminophen 5-325 MG per tablet   Commonly known as: PERCOCET   Take 1-2 tablets by mouth every 3 (three) hours as needed (moderate to severe pain (when tolerating fluids)).      thyroid 15 MG tablet   Commonly known as: ARMOUR   Take 15 mg by mouth 4 (four) times daily.           Follow-up Information    Follow up with Meriel Pica, MD. (office will call)    Contact information:   [address] [place] [address] [phone]           Signed: Meriel Pica 02/11/2012, 8:10 AM

## 2012-02-11 NOTE — Progress Notes (Signed)
Pt discharged home with husband... Condition stable... No equipment... Ambulated to car with E. Toms, RN 

## 2012-07-11 ENCOUNTER — Other Ambulatory Visit: Payer: Self-pay | Admitting: Family Medicine

## 2012-07-11 DIAGNOSIS — R921 Mammographic calcification found on diagnostic imaging of breast: Secondary | ICD-10-CM

## 2012-07-27 ENCOUNTER — Other Ambulatory Visit: Payer: Self-pay | Admitting: Family Medicine

## 2012-07-27 ENCOUNTER — Ambulatory Visit
Admission: RE | Admit: 2012-07-27 | Discharge: 2012-07-27 | Disposition: A | Discharge status: Home / Self Care | Payer: BC Managed Care – PPO | Source: Ambulatory Visit | Attending: Family Medicine | Admitting: Family Medicine

## 2012-07-27 DIAGNOSIS — R921 Mammographic calcification found on diagnostic imaging of breast: Secondary | ICD-10-CM

## 2012-08-08 ENCOUNTER — Other Ambulatory Visit (INDEPENDENT_AMBULATORY_CARE_PROVIDER_SITE_OTHER): Payer: Self-pay | Admitting: General Surgery

## 2012-08-08 ENCOUNTER — Ambulatory Visit (INDEPENDENT_AMBULATORY_CARE_PROVIDER_SITE_OTHER): Payer: BC Managed Care – PPO | Admitting: General Surgery

## 2012-08-08 ENCOUNTER — Encounter (INDEPENDENT_AMBULATORY_CARE_PROVIDER_SITE_OTHER): Payer: Self-pay | Admitting: General Surgery

## 2012-08-08 VITALS — BP 134/78 | HR 76 | Temp 97.9°F | Resp 18 | Ht 64.0 in | Wt 196.0 lb

## 2012-08-08 DIAGNOSIS — R921 Mammographic calcification found on diagnostic imaging of breast: Secondary | ICD-10-CM | POA: Insufficient documentation

## 2012-08-08 DIAGNOSIS — R928 Other abnormal and inconclusive findings on diagnostic imaging of breast: Secondary | ICD-10-CM

## 2012-08-08 NOTE — Patient Instructions (Signed)
Plan for MRI breast and referral to high risk clinic

## 2012-08-08 NOTE — Progress Notes (Signed)
Subjective:     Patient ID: Allison Dudley, female   DOB: July 27, 1951, 61 y.o.   MRN: [id number]  HPI We are asked to see the patient in consultation by Dr. Juel Burrow to evaluate her for her left breast calcifications. The patient is a 61 year old white female who recently went for a routine screening mammogram. At that time she was noted to have some abnormal appearing calcifications centrally in the left breast. It sounds as though an attempt was made at stereotactic biopsy earlier this year but the calcifications were too faint to be seen. She notes an occasional sharp stabbing pain in the left breast but this does not last for a long period she denies any discharge from her nipple. She has a personal history of some small cysts in the left breast. She does have a very strong family history of breast cancer with her mother dying at age 26 of breast cancer and her sister dying at age 26 of breast cancer.  Review of Systems  Constitutional: Negative.   HENT: Negative.   Eyes: Negative.   Respiratory: Negative.   Cardiovascular: Negative.   Genitourinary: Negative.   Musculoskeletal: Negative.   Skin: Negative.   Neurological: Negative.   Hematological: Negative.   Psychiatric/Behavioral: Negative.        Objective:   Physical Exam  Constitutional: She is oriented to person, place, and time. She appears well-developed and well-nourished.  HENT:  Head: Normocephalic and atraumatic.  Eyes: Conjunctivae normal and EOM are normal. Pupils are equal, round, and reactive to light.  Neck: Normal range of motion. Neck supple.  Cardiovascular: Normal rate, regular rhythm and normal heart sounds.   Pulmonary/Chest: Effort normal and breath sounds normal.       There is no palpable mass in either breast. There is no palpable axillary or supraclavicular cervical lymphadenopathy.  Abdominal: Soft. Bowel sounds are normal. She exhibits no mass. There is no tenderness.  Musculoskeletal: Normal range of  motion.  Lymphadenopathy:    She has no cervical adenopathy.  Neurological: She is alert and oriented to person, place, and time.  Skin: Skin is warm and dry.  Psychiatric: She has a normal mood and affect. Her behavior is normal.       Assessment:     The patient has some abnormal appearing calcifications centrally in the left breast. She also has a very strong family history of breast cancer. Because of this I think should be a good candidate for an MRI study. I also think she needs to be evaluated at the high risk clinic and possibly have genetic testing. If the MRI is negative then we may need to do an open biopsy of the area where the calcifications are. I have discussed this with her including the risks and benefits of surgery as well as some of the technical aspects and she understands and agrees with this treatment plan.    Plan:     Plan initially for MRI of the breast and referral to the high risk clinic

## 2012-08-14 ENCOUNTER — Telehealth: Payer: Self-pay | Admitting: Oncology

## 2012-08-14 NOTE — Telephone Encounter (Signed)
LVOM for pt to return call in re High Risk Clinic.

## 2012-08-16 ENCOUNTER — Telehealth: Payer: Self-pay | Admitting: Oncology

## 2012-08-16 NOTE — Telephone Encounter (Signed)
S/W pt in re New High Risk clinic 02/04 @ 10:30 w/Dr. Welton Flakes.  Referring Dr. Sinda Du packet mailed.

## 2012-08-17 ENCOUNTER — Telehealth: Payer: Self-pay | Admitting: Oncology

## 2012-08-17 NOTE — Telephone Encounter (Signed)
C/D 08/17/12 for appt. 09/12/12

## 2012-08-24 ENCOUNTER — Other Ambulatory Visit: Payer: BC Managed Care – PPO

## 2012-08-24 ENCOUNTER — Ambulatory Visit
Admission: RE | Admit: 2012-08-24 | Discharge: 2012-08-24 | Disposition: A | Discharge status: Home / Self Care | Payer: BC Managed Care – PPO | Source: Ambulatory Visit | Attending: General Surgery | Admitting: General Surgery

## 2012-08-24 DIAGNOSIS — R921 Mammographic calcification found on diagnostic imaging of breast: Secondary | ICD-10-CM

## 2012-08-24 MED ORDER — GADOBENATE DIMEGLUMINE 529 MG/ML IV SOLN
17.0000 mL | Freq: Once | INTRAVENOUS | Status: AC | PRN
  Administered 2012-08-24: 17 mL via INTRAVENOUS

## 2012-09-11 ENCOUNTER — Encounter (INDEPENDENT_AMBULATORY_CARE_PROVIDER_SITE_OTHER): Payer: Self-pay | Admitting: General Surgery

## 2012-09-11 ENCOUNTER — Ambulatory Visit (INDEPENDENT_AMBULATORY_CARE_PROVIDER_SITE_OTHER): Payer: BC Managed Care – PPO | Admitting: General Surgery

## 2012-09-11 VITALS — BP 130/84 | HR 70 | Temp 97.8°F | Resp 18 | Ht 65.0 in | Wt 198.2 lb

## 2012-09-11 DIAGNOSIS — R921 Mammographic calcification found on diagnostic imaging of breast: Secondary | ICD-10-CM

## 2012-09-11 DIAGNOSIS — R928 Other abnormal and inconclusive findings on diagnostic imaging of breast: Secondary | ICD-10-CM

## 2012-09-11 NOTE — Patient Instructions (Signed)
Call after appt at high risk clinic tomorrow

## 2012-09-12 ENCOUNTER — Encounter (HOSPITAL_BASED_OUTPATIENT_CLINIC_OR_DEPARTMENT_OTHER): Payer: BC Managed Care – PPO | Admitting: Oncology

## 2012-09-12 ENCOUNTER — Encounter: Payer: Self-pay | Admitting: Oncology

## 2012-09-12 VITALS — BP 155/84 | HR 67 | Temp 97.8°F | Resp 20 | Ht 65.0 in | Wt 197.8 lb

## 2012-09-12 DIAGNOSIS — R921 Mammographic calcification found on diagnostic imaging of breast: Secondary | ICD-10-CM

## 2012-09-12 NOTE — Progress Notes (Signed)
This encounter was created in error - please disregard.

## 2012-10-04 NOTE — Progress Notes (Signed)
Subjective:     Patient ID: Allison Dudley, female   DOB: 1950-12-11, 62 y.o.   MRN: [id number]  HPI The patient is a 62 year old white female who we saw her recently with some left breast calcifications. She does have a family history for breast cancer. Because of this we had her undergo an MRI study since her last visit. The MRI study did not show anything significant and excision of the abnormal calcifications in the left breast is still recommended. She denies any breast pain. She denies any discharge from her nipple.  Review of Systems  Constitutional: Negative.   HENT: Negative.   Eyes: Negative.   Respiratory: Negative.   Cardiovascular: Negative.   Gastrointestinal: Negative.   Endocrine: Negative.   Genitourinary: Negative.   Musculoskeletal: Negative.   Skin: Negative.   Allergic/Immunologic: Negative.   Neurological: Negative.   Hematological: Negative.   Psychiatric/Behavioral: Negative.        Objective:   Physical Exam  Constitutional: She is oriented to person, place, and time. She appears well-developed and well-nourished.  HENT:  Head: Normocephalic and atraumatic.  Eyes: Conjunctivae and EOM are normal. Pupils are equal, round, and reactive to light.  Neck: Normal range of motion. Neck supple.  Cardiovascular: Normal rate, regular rhythm and normal heart sounds.   Pulmonary/Chest: Effort normal and breath sounds normal.  There is no palpable mass in either breast. There is no palpable axillary or supraclavicular cervical lymphadenopathy.  Abdominal: Soft. Bowel sounds are normal. She exhibits no mass. There is no tenderness.  Musculoskeletal: Normal range of motion.  Lymphadenopathy:    She has no cervical adenopathy.  Neurological: She is alert and oriented to person, place, and time.  Skin: Skin is warm and dry.  Psychiatric: She has a normal mood and affect. Her behavior is normal.       Assessment:     The patient has some abnormal calcifications  centrally in the left breast. Unfortunately these were not amenable to biopsy. Because of this excisional biopsy is recommended. She will require wire localization of the area. I've discussed with her in detail the risks and benefits of the operation to do this as well as some of the technical aspects and she understands and wishes to proceed.     Plan:     Plan for left breast wire localized lumpectomy

## 2015-09-26 ENCOUNTER — Other Ambulatory Visit: Payer: Self-pay | Admitting: Obstetrics and Gynecology

## 2015-09-26 DIAGNOSIS — Z9189 Other specified personal risk factors, not elsewhere classified: Secondary | ICD-10-CM

## 2016-08-30 ENCOUNTER — Other Ambulatory Visit (HOSPITAL_BASED_OUTPATIENT_CLINIC_OR_DEPARTMENT_OTHER): Payer: Self-pay

## 2016-08-30 DIAGNOSIS — G4730 Sleep apnea, unspecified: Secondary | ICD-10-CM

## 2016-10-14 ENCOUNTER — Ambulatory Visit (HOSPITAL_BASED_OUTPATIENT_CLINIC_OR_DEPARTMENT_OTHER): Payer: Medicare Other | Attending: Family Medicine | Admitting: Internal Medicine

## 2016-10-14 DIAGNOSIS — G4700 Insomnia, unspecified: Secondary | ICD-10-CM | POA: Insufficient documentation

## 2016-10-14 DIAGNOSIS — G4733 Obstructive sleep apnea (adult) (pediatric): Secondary | ICD-10-CM | POA: Insufficient documentation

## 2016-10-14 DIAGNOSIS — R0681 Apnea, not elsewhere classified: Secondary | ICD-10-CM | POA: Diagnosis present

## 2016-10-14 DIAGNOSIS — G4730 Sleep apnea, unspecified: Secondary | ICD-10-CM

## 2016-10-17 DIAGNOSIS — G4730 Sleep apnea, unspecified: Secondary | ICD-10-CM

## 2016-10-17 NOTE — Procedures (Signed)
  Patient Name: Allison Dudley, [place] Date: 10/14/2016 Gender: Female D.O.B: 1950-12-27 Age (years): 66 Referring Provider: Claris Gower Height (inches): 10 Interpreting Physician: Baird Lyons MD, ABSM Weight (lbs): 185 RPSGT: Laren Everts BMI: 31 MRN: [id number] Neck Size: 15.50 CLINICAL INFORMATION Sleep Study Type: NPSG  Indication for sleep study: Fatigue, Obesity  Epworth Sleepiness Score: 5  SLEEP STUDY TECHNIQUE As per the AASM Manual for the Scoring of Sleep and Associated Events v2.3 (April 2016) with a hypopnea requiring 4% desaturations.  The channels recorded and monitored were frontal, central and occipital EEG, electrooculogram (EOG), submentalis EMG (chin), nasal and oral airflow, thoracic and abdominal wall motion, anterior tibialis EMG, snore microphone, electrocardiogram, and pulse oximetry.  MEDICATIONS Medications self-administered by patient taken the night of the study : none reported  SLEEP ARCHITECTURE The study was initiated at 10:28:49 PM and ended at 4:45:13 AM.  Sleep onset time was 29.1 minutes and the sleep efficiency was 72.1%. The total sleep time was 271.5 minutes.  Stage REM latency was 145.5 minutes.  The patient spent 4.79% of the night in stage N1 sleep, 78.82% in stage N2 sleep, 0.00% in stage N3 and 16.39% in REM.  Alpha intrusion was absent.  Supine sleep was 45.49%.  RESPIRATORY PARAMETERS The overall apnea/hypopnea index (AHI) was 5.7 per hour. There were 8 total apneas, including 8 obstructive, 0 central and 0 mixed apneas. There were 18 hypopneas and 15 RERAs.  The AHI during Stage REM sleep was 28.3 per hour.  AHI while supine was 7.3 per hour.  The mean oxygen saturation was 94.92%. The minimum SpO2 during sleep was 84.00%.  Moderate snoring was noted during this study.  CARDIAC DATA The 2 lead EKG demonstrated sinus rhythm. The mean heart rate was 67.66 beats per minute. Other EKG findings include:  PVCs.  LEG MOVEMENT DATA The total PLMS were 3 with a resulting PLMS index of 0.66. Associated arousal with leg movement index was 0.7 .  IMPRESSIONS - Minimal obstructive sleep apnea occurred during this study (AHI = 5.7/h). - No significant central sleep apnea occurred during this study (CAI = 0.0/h). - Mild oxygen desaturation was noted during this study (Min O2 = 84.00%, Mean 94.9%). - The patient snored with Moderate snoring volume. - EKG findings include PVCs. - Clinically significant periodic limb movements did not occur during sleep. No significant associated arousals. - Difficulty initiating sleep- insomnia  DIAGNOSIS - Obstructive Sleep Apnea (327.23 [G47.33 ICD-10]) - Insomnia  RECOMMENDATIONS - Positional therapy avoiding supine position during sleep. - Very mild obstructive sleep apnea. Return to provider to discuss treatment options. Common options might include observation, weight loss, sleep off flat of back, chin strap, oral appliance. - Avoid alcohol, sedatives and other CNS depressants that may worsen sleep apnea and disrupt normal sleep architecture. - Sleep hygiene should be reviewed to assess factors that may improve sleep quality. - Weight management and regular exercise should be initiated or continued if appropriate.  [Electronically signed] 10/17/2016 11:51 AM  Baird Lyons MD, ABSM Diplomate, American Board of Sleep Medicine   NPI: [id number]  [place], American Board of Sleep Medicine  ELECTRONICALLY SIGNED ON:  10/17/2016, 11:47 AM [place] PH: [phone]   FX: [phone] [place]

## 2018-05-05 ENCOUNTER — Other Ambulatory Visit: Payer: Self-pay | Admitting: Family Medicine

## 2018-05-05 DIAGNOSIS — E2839 Other primary ovarian failure: Secondary | ICD-10-CM

## 2018-05-05 DIAGNOSIS — Z1231 Encounter for screening mammogram for malignant neoplasm of breast: Secondary | ICD-10-CM

## 2018-05-10 ENCOUNTER — Other Ambulatory Visit: Payer: Self-pay | Admitting: Family Medicine

## 2018-05-10 DIAGNOSIS — R921 Mammographic calcification found on diagnostic imaging of breast: Secondary | ICD-10-CM

## 2018-05-16 ENCOUNTER — Other Ambulatory Visit: Payer: Medicare Other

## 2018-05-23 ENCOUNTER — Ambulatory Visit: Payer: Medicare Other

## 2018-05-23 ENCOUNTER — Ambulatory Visit
Admission: RE | Admit: 2018-05-23 | Discharge: 2018-05-23 | Disposition: A | Discharge status: Home / Self Care | Payer: Medicare Other | Source: Ambulatory Visit | Attending: Family Medicine | Admitting: Family Medicine

## 2018-05-23 DIAGNOSIS — R921 Mammographic calcification found on diagnostic imaging of breast: Secondary | ICD-10-CM

## 2018-07-18 ENCOUNTER — Ambulatory Visit
Admission: RE | Admit: 2018-07-18 | Discharge: 2018-07-18 | Disposition: A | Discharge status: Home / Self Care | Payer: Medicare Other | Source: Ambulatory Visit | Attending: Family Medicine | Admitting: Family Medicine

## 2018-07-18 DIAGNOSIS — E2839 Other primary ovarian failure: Secondary | ICD-10-CM

## 2018-09-26 ENCOUNTER — Encounter (HOSPITAL_COMMUNITY): Payer: Self-pay | Admitting: Emergency Medicine

## 2018-09-26 ENCOUNTER — Emergency Department (HOSPITAL_COMMUNITY)
Admission: EM | Admit: 2018-09-26 | Discharge: 2018-09-26 | Disposition: A | Discharge status: Home / Self Care | Payer: Medicare Other | Attending: Emergency Medicine | Admitting: Emergency Medicine

## 2018-09-26 DIAGNOSIS — Z5321 Procedure and treatment not carried out due to patient leaving prior to being seen by health care provider: Secondary | ICD-10-CM | POA: Insufficient documentation

## 2018-09-26 DIAGNOSIS — I10 Essential (primary) hypertension: Secondary | ICD-10-CM | POA: Insufficient documentation

## 2018-09-26 LAB — COMPREHENSIVE METABOLIC PANEL
ALK PHOS: 71 U/L (ref 38–126)
ALT: 53 U/L — ABNORMAL HIGH (ref 0–44)
ANION GAP: 11 (ref 5–15)
AST: 42 U/L — ABNORMAL HIGH (ref 15–41)
Albumin: 4.5 g/dL (ref 3.5–5.0)
BUN: 15 mg/dL (ref 8–23)
CALCIUM: 10.1 mg/dL (ref 8.9–10.3)
CHLORIDE: 100 mmol/L (ref 98–111)
CO2: 27 mmol/L (ref 22–32)
Creatinine, Ser: 0.66 mg/dL (ref 0.44–1.00)
GFR calc non Af Amer: >60 mL/min (ref >60)
Glucose, Bld: 180 mg/dL — ABNORMAL HIGH (ref 70–99)
POTASSIUM: 3.6 mmol/L (ref 3.5–5.1)
SODIUM: 138 mmol/L (ref 135–145)
Total Bilirubin: 0.6 mg/dL (ref 0.3–1.2)
Total Protein: 8.2 g/dL — ABNORMAL HIGH (ref 6.5–8.1)

## 2018-09-26 LAB — CBC
HCT: 43.2 % (ref 36.0–46.0)
HEMOGLOBIN: 14.3 g/dL (ref 12.0–15.0)
MCH: 29.2 pg (ref 26.0–34.0)
MCHC: 33.1 g/dL (ref 30.0–36.0)
MCV: 88.2 fL (ref 80.0–100.0)
NRBC: 0 % (ref 0.0–0.2)
PLATELETS: 290 10*3/uL (ref 150–400)
RBC: 4.9 MIL/uL (ref 3.87–5.11)
RDW: 12.2 % (ref 11.5–15.5)
WBC: 16.4 10*3/uL — ABNORMAL HIGH (ref 4.0–10.5)

## 2018-09-26 LAB — URINALYSIS, ROUTINE W REFLEX MICROSCOPIC
Bilirubin Urine: NEGATIVE
GLUCOSE, UA: 50 mg/dL — AB
Ketones, ur: 20 mg/dL — AB
Leukocytes,Ua: NEGATIVE
Nitrite: NEGATIVE
Protein, ur: 30 mg/dL — AB
RBC / HPF: >50 RBC/hpf — ABNORMAL HIGH (ref 0–5)
Specific Gravity, Urine: 1.019 (ref 1.005–1.030)
pH: 5 (ref 5.0–8.0)

## 2018-09-26 LAB — LIPASE, BLOOD: LIPASE: 24 U/L (ref 11–51)

## 2018-09-26 MED ORDER — SODIUM CHLORIDE 0.9% FLUSH: 3.0000 mL | Freq: Once | INTRAVENOUS | Status: DC

## 2018-09-26 NOTE — ED Notes (Signed)
Pt/family stated that she was feeling better and they didn't want to wait any longer.  I advised that they return if symptoms return.

## 2018-09-26 NOTE — ED Triage Notes (Signed)
Pt reports right sided flank pain, nausea and systolic bp in the 431'V at her PCP, states she has never had high bp before. Also reports difficulty urinating and that "only a little bit comes out".

## 2018-09-28 LAB — URINE CULTURE: Culture: <10000 — AB

## 2018-10-26 ENCOUNTER — Other Ambulatory Visit: Payer: Self-pay | Admitting: Family Medicine

## 2018-10-26 DIAGNOSIS — R7989 Other specified abnormal findings of blood chemistry: Secondary | ICD-10-CM

## 2018-10-26 DIAGNOSIS — R945 Abnormal results of liver function studies: Principal | ICD-10-CM

## 2018-11-07 ENCOUNTER — Other Ambulatory Visit: Payer: Medicare Other

## 2018-11-14 ENCOUNTER — Other Ambulatory Visit: Payer: Medicare Other

## 2018-12-05 ENCOUNTER — Other Ambulatory Visit: Payer: Medicare Other

## 2019-01-02 ENCOUNTER — Inpatient Hospital Stay: Admission: RE | Admit: 2019-01-02 | Payer: Medicare Other | Source: Ambulatory Visit

## 2019-02-13 ENCOUNTER — Other Ambulatory Visit: Payer: Medicare Other

## 2019-03-13 ENCOUNTER — Ambulatory Visit
Admission: RE | Admit: 2019-03-13 | Discharge: 2019-03-13 | Disposition: A | Discharge status: Home / Self Care | Payer: Medicare Other | Source: Ambulatory Visit | Attending: Family Medicine | Admitting: Family Medicine

## 2019-03-13 ENCOUNTER — Other Ambulatory Visit: Payer: Self-pay

## 2019-03-13 DIAGNOSIS — R7989 Other specified abnormal findings of blood chemistry: Secondary | ICD-10-CM

## 2019-03-13 MED ORDER — IOPAMIDOL (ISOVUE-300) INJECTION 61%
100.0000 mL | Freq: Once | INTRAVENOUS | Status: AC | PRN
  Administered 2019-03-13: 100 mL via INTRAVENOUS

## 2019-04-23 ENCOUNTER — Other Ambulatory Visit: Payer: Self-pay

## 2019-04-27 ENCOUNTER — Other Ambulatory Visit: Payer: Self-pay

## 2020-07-24 ENCOUNTER — Other Ambulatory Visit: Payer: Self-pay | Admitting: Family Medicine

## 2020-07-24 DIAGNOSIS — E2839 Other primary ovarian failure: Secondary | ICD-10-CM

## 2020-07-24 DIAGNOSIS — Z1231 Encounter for screening mammogram for malignant neoplasm of breast: Secondary | ICD-10-CM

## 2022-02-01 ENCOUNTER — Other Ambulatory Visit: Payer: Self-pay | Admitting: Family Medicine

## 2022-02-01 DIAGNOSIS — Z1231 Encounter for screening mammogram for malignant neoplasm of breast: Secondary | ICD-10-CM

## 2022-03-02 ENCOUNTER — Ambulatory Visit
Admission: RE | Admit: 2022-03-02 | Discharge: 2022-03-02 | Disposition: A | Discharge status: Home / Self Care | Payer: Medicare Other | Source: Ambulatory Visit | Attending: Family Medicine | Admitting: Family Medicine

## 2022-03-02 DIAGNOSIS — Z1231 Encounter for screening mammogram for malignant neoplasm of breast: Secondary | ICD-10-CM

## 2023-03-24 ENCOUNTER — Other Ambulatory Visit: Payer: Self-pay | Admitting: Family Medicine

## 2023-03-24 ENCOUNTER — Other Ambulatory Visit: Payer: Self-pay | Admitting: Family

## 2023-03-24 DIAGNOSIS — Z9189 Other specified personal risk factors, not elsewhere classified: Secondary | ICD-10-CM

## 2023-03-24 DIAGNOSIS — Z1231 Encounter for screening mammogram for malignant neoplasm of breast: Secondary | ICD-10-CM

## 2023-04-12 ENCOUNTER — Ambulatory Visit: Payer: Medicare Other | Admitting: Dietician

## 2023-04-26 ENCOUNTER — Ambulatory Visit: Payer: Medicare Other

## 2023-05-03 ENCOUNTER — Ambulatory Visit: Payer: Medicare Other

## 2023-05-10 ENCOUNTER — Other Ambulatory Visit: Payer: Medicare Other

## 2023-05-14 NOTE — Progress Notes (Deleted)
Cardiology Office Note   Date:  05/14/2023   ID:  Allison Dudley, [place] 1951-04-07, MRN [id number]  PCP:  Kaleen Mask, MD  Cardiologist:   None Referring:  ***  No chief complaint on file.     History of Present Illness: Allison Dudley is a 72 y.o. female who presents for evaluation of HTN and dyslipidemia:  ***     Past Medical History:  Diagnosis Date   Hypothyroidism    PONV (postoperative nausea and vomiting)     Past Surgical History:  Procedure Laterality Date   ABDOMINAL HYSTERECTOMY  02/09/2012   Procedure: HYSTERECTOMY ABDOMINAL;  Surgeon: Meriel Pica, MD;  Location: [facility];  Service: Gynecology;  Laterality: N/A;   BUNIONECTOMY  1988   right   CESAREAN SECTION  1988, 1989   x 2   DILATION AND CURETTAGE OF UTERUS     HEMORROIDECTOMY     LIGATION / DIVISION SAPHENOUS VEIN     SALPINGOOPHORECTOMY  02/09/2012   Procedure: SALPINGO OOPHERECTOMY;  Surgeon: Meriel Pica, MD;  Location: [facility];  Service: Gynecology;  Laterality: Bilateral;   TONSILLECTOMY  1966     Current Outpatient Medications  Medication Sig Dispense Refill   ALPRAZolam (XANAX) 0.5 MG tablet as needed.     lovastatin (MEVACOR) 20 MG tablet      Multiple Vitamin (MULTIVITAMIN WITH MINERALS) TABS Take 1 tablet by mouth daily.     thyroid (ARMOUR) 15 MG tablet Take 15 mg by mouth 4 (four) times daily.     No current facility-administered medications for this visit.    Allergies:   Patient has no known allergies.    Social History:  The patient  reports that she has never smoked. She has never used smokeless tobacco. She reports that she does not drink alcohol and does not use drugs.   Family History:  The patient's ***family history includes Cancer in her father, mother, paternal grandmother, and sister; Heart disease in her maternal grandfather; Other in her paternal grandfather; Tuberculosis in her maternal grandmother.    ROS:  Please  see the history of present illness.   Otherwise, review of systems are positive for {NONE DEFAULTED:18576}.   All other systems are reviewed and negative.    PHYSICAL EXAM: VS:  There were no vitals taken for this visit. , BMI There is no height or weight on file to calculate BMI. GENERAL:  Well appearing HEENT:  Pupils equal round and reactive, fundi not visualized, oral mucosa unremarkable NECK:  No jugular venous distention, waveform within normal limits, carotid upstroke brisk and symmetric, no bruits, no thyromegaly LYMPHATICS:  No cervical, inguinal adenopathy LUNGS:  Clear to auscultation bilaterally BACK:  No CVA tenderness CHEST:  Unremarkable HEART:  PMI not displaced or sustained,S1 and S2 within normal limits, no S3, no S4, no clicks, no rubs, *** murmurs ABD:  Flat, positive bowel sounds normal in frequency in pitch, no bruits, no rebound, no guarding, no midline pulsatile mass, no hepatomegaly, no splenomegaly EXT:  2 plus pulses throughout, no edema, no cyanosis no clubbing SKIN:  No rashes no nodules NEURO:  Cranial nerves II through XII grossly intact, motor grossly intact throughout PSYCH:  Cognitively intact, oriented to person place and time    EKG:        Recent Labs: No results found for requested labs within last 365 days.    Lipid Panel No results found for: "CHOL", "TRIG", "HDL", "CHOLHDL", "VLDL", "  LDLCALC", "LDLDIRECT"    Wt Readings from Last 3 Encounters:  10/14/16 185 lb (83.9 kg)  09/12/12 197 lb 12.8 oz (89.7 kg)  09/11/12 198 lb 3.2 oz (89.9 kg)      Other studies Reviewed: Additional studies/ records that were reviewed today include: ***. Review of the above records demonstrates:  Please see elsewhere in the note.  ***   ASSESSMENT AND PLAN:  HTN: ***  DYSLIPIDEMIA:  ***    Current medicines are reviewed at length with the patient today.  The patient {ACTIONS; HAS/DOES NOT HAVE:19233} concerns regarding medicines.  The following  changes have been made:  {PLAN; NO CHANGE:13088:s}  Labs/ tests ordered today include: *** No orders of the defined types were placed in this encounter.    Disposition:   FU with ***    Signed, Rollene Rotunda, MD  05/14/2023 8:47 PM     [facility]

## 2023-05-16 ENCOUNTER — Ambulatory Visit: Payer: Self-pay | Admitting: Cardiology

## 2023-05-16 DIAGNOSIS — I10 Essential (primary) hypertension: Secondary | ICD-10-CM

## 2023-05-16 DIAGNOSIS — E785 Hyperlipidemia, unspecified: Secondary | ICD-10-CM

## 2023-05-19 ENCOUNTER — Ambulatory Visit: Payer: Medicare Other | Admitting: Cardiology

## 2023-05-27 ENCOUNTER — Encounter: Payer: Self-pay | Admitting: Family

## 2023-05-30 DIAGNOSIS — I10 Essential (primary) hypertension: Secondary | ICD-10-CM | POA: Insufficient documentation

## 2023-05-30 DIAGNOSIS — E785 Hyperlipidemia, unspecified: Secondary | ICD-10-CM | POA: Insufficient documentation

## 2023-05-30 NOTE — Progress Notes (Signed)
Cardiology Office Note   Date:  05/31/2023   ID:  Allison Dudley [place], [place] October 18, 1950, MRN [id number]  PCP:  Kaleen Mask, MD  Cardiologist:   None Referring:  Kaleen Mask, MD  Chief Complaint  Patient presents with   Hypertension    History of Present Illness: Allison Dudley is a 72 y.o. female who presents for evaluation of multiple cardiovascular risk factors.  She has no prior cardiac history.  She does have significant cardiovascular risk factors however.  I see an A1c done recently with 7.5.  This would be a new diagnosis of diabetes.  She has had hypertension probably although she is not currently getting treatment .  There has been some dyslipidemia in the past.    She has noted that her blood pressure has been elevated at home.  She denies any cardiovascular symptoms.  She does not have any palpitations, presyncope or syncope.  She has no PND or orthopnea.  She has some mild shortness of breath.  This happens when she exercises.  She joined a hiking club and she walks every day.  She says her heart rate goes up when she walks but she thinks it is appropriate.  She has been dieting and has lost a little weight.  Past Medical History:  Diagnosis Date   Hypothyroidism    PONV (postoperative nausea and vomiting)     Past Surgical History:  Procedure Laterality Date   ABDOMINAL HYSTERECTOMY  02/09/2012   Procedure: HYSTERECTOMY ABDOMINAL;  Surgeon: Meriel Pica, MD;  Location: [facility];  Service: Gynecology;  Laterality: N/A;   BUNIONECTOMY  1988   right   CESAREAN SECTION  1988, 1989   x 2   DILATION AND CURETTAGE OF UTERUS     HEMORROIDECTOMY     LIGATION / DIVISION SAPHENOUS VEIN     SALPINGOOPHORECTOMY  02/09/2012   Procedure: SALPINGO OOPHERECTOMY;  Surgeon: Meriel Pica, MD;  Location: [facility];  Service: Gynecology;  Laterality: Bilateral;   TONSILLECTOMY  1966     Current Outpatient Medications  Medication  Sig Dispense Refill   Multiple Vitamin (MULTIVITAMIN WITH MINERALS) TABS Take 1 tablet by mouth daily.     ramipril (ALTACE) 2.5 MG capsule Take 1 capsule (2.5 mg total) by mouth daily. 90 capsule 3   thyroid (ARMOUR) 15 MG tablet Take 15 mg by mouth 4 (four) times daily.     ALPRAZolam (XANAX) 0.5 MG tablet as needed. (Patient not taking: Reported on 05/31/2023)     No current facility-administered medications for this visit.    Allergies:   Patient has no known allergies.    Social History:  The patient  reports that she has never smoked. She has never used smokeless tobacco. She reports that she does not drink alcohol and does not use drugs.   Family History:  The patient's family history includes Cancer in her father, mother, paternal grandmother, and sister; Heart disease in her maternal grandfather; Other in her paternal grandfather; Tuberculosis in her maternal grandmother.    ROS:  Please see the history of present illness.   Otherwise, review of systems are positive for none.   All other systems are reviewed and negative.    PHYSICAL EXAM: VS:  BP (!) 142/96   Pulse 81   Ht 5\' 5"  (1.651 m)   Wt 182 lb (82.6 kg)   SpO2 98%   BMI 30.29 kg/m  , BMI Body mass index is 30.29  kg/m. GENERAL:  Well appearing HEENT:  Pupils equal round and reactive, fundi not visualized, oral mucosa unremarkable NECK:  No jugular venous distention, waveform within normal limits, carotid upstroke brisk and symmetric, no bruits, no thyromegaly LYMPHATICS:  No cervical, inguinal adenopathy LUNGS:  Clear to auscultation bilaterally BACK:  No CVA tenderness CHEST:  Unremarkable HEART:  PMI not displaced or sustained,S1 and S2 within normal limits, no S3, no S4, no clicks, no rubs, no murmurs ABD:  Flat, positive bowel sounds normal in frequency in pitch, no bruits, no rebound, no guarding, no midline pulsatile mass, no hepatomegaly, no splenomegaly EXT:  2 plus pulses throughout, no edema, no  cyanosis no clubbing SKIN:  No rashes no nodules NEURO:  Cranial nerves II through XII grossly intact, motor grossly intact throughout [facility]:  Cognitively intact, oriented to person place and time    EKG:  EKG Interpretation Date/Time:  Tuesday May 31 2023 14:46:17 EDT Ventricular Rate:  69 PR Interval:  178 QRS Duration:  84 QT Interval:  404 QTC Calculation: 432 R Axis:   53  Text Interpretation: Normal sinus rhythm Nonspecific T wave abnormality When compared with ECG of 31-Jan-2012 10:29, No significant change was found Confirmed by Rollene Rotunda ([id number]) on 05/31/2023 2:58:09 PM     Recent Labs: No results found for requested labs within last 365 days.    Lipid Panel No results found for: "CHOL", "TRIG", "HDL", "CHOLHDL", "VLDL", "LDLCALC", "LDLDIRECT"    Wt Readings from Last 3 Encounters:  05/31/23 182 lb (82.6 kg)  10/14/16 185 lb (83.9 kg)  09/12/12 197 lb 12.8 oz (89.7 kg)      Other studies Reviewed: Additional studies/ records that were reviewed today include: Labs PCP. Review of the above records demonstrates:  Please see elsewhere in the note.     ASSESSMENT AND PLAN:  HTN: Her blood pressure is not controlled and I am going to stop Altace 2.5 mg daily.  Dyslipidemia:  I am going to order a lipid profile and LPa.  If she has elevated calcium I goals of therapy will be an LDL at least less than 70.  She would be reluctant to take a statin but she might.  Elevated blood sugar: I told her she has a diagnosis of diabetes and she wants to try diet first.  She will follow-up with her primary care.   SOB: She has some mild shortness of breath.  I am going to screen her with a coronary calcium score.  Further testing will be based on this.   Current medicines are reviewed at length with the patient today.  The patient does not have concerns regarding medicines.  The following changes have been made:  no change  Labs/ tests ordered today include:    Orders Placed This Encounter  Procedures   CT CARDIAC SCORING   Lipid Profile   Lipoprotein A (LPA)   EKG 12-Lead     Disposition:   FU with me in 3 months.     Signed, Rollene Rotunda, MD  05/31/2023 3:00 PM    [facility]

## 2023-05-31 ENCOUNTER — Ambulatory Visit: Payer: Medicare Other | Attending: Cardiology | Admitting: Cardiology

## 2023-05-31 ENCOUNTER — Encounter: Payer: Self-pay | Admitting: Cardiology

## 2023-05-31 ENCOUNTER — Ambulatory Visit
Admission: RE | Admit: 2023-05-31 | Discharge: 2023-05-31 | Disposition: A | Discharge status: Home / Self Care | Payer: Medicare Other | Source: Ambulatory Visit | Attending: Family Medicine | Admitting: Family Medicine

## 2023-05-31 VITALS — BP 142/96 | HR 81 | Ht 65.0 in | Wt 182.0 lb

## 2023-05-31 DIAGNOSIS — I10 Essential (primary) hypertension: Secondary | ICD-10-CM

## 2023-05-31 DIAGNOSIS — E785 Hyperlipidemia, unspecified: Secondary | ICD-10-CM

## 2023-05-31 DIAGNOSIS — Z1231 Encounter for screening mammogram for malignant neoplasm of breast: Secondary | ICD-10-CM

## 2023-05-31 MED ORDER — RAMIPRIL 2.5 MG PO CAPS: 2.5000 mg | ORAL_CAPSULE | Freq: Every day | ORAL | 3 refills | Status: DC

## 2023-05-31 NOTE — Patient Instructions (Addendum)
Medication Instructions:  Start Altace 2.5 mg tablet once daily. Stop taking Mevacor. *If you need a refill on your cardiac medications before your next appointment, please call your pharmacy*   Lab Work: , Fasting Lipid and Lpa. If you have labs (blood work) drawn today and your tests are completely normal, you will receive your results only by: MyChart Message (if you have MyChart) OR A paper copy in the mail If you have any lab test that is abnormal or we need to change your treatment, we will call you to review the results.  Testing: Calcium score to be done at [facility] office.    Follow-Up: At [facility], you and your health needs are our priority.  As part of our continuing mission to provide you with exceptional heart care, we have created designated Provider Care Teams.  These Care Teams include your primary Cardiologist (physician) and Advanced Practice Providers (APPs -  Physician Assistants and Nurse Practitioners) who all work together to provide you with the care you need, when you need it.  We recommend signing up for the patient portal called "MyChart".  Sign up information is provided on this After Visit Summary.  MyChart is used to connect with patients for Virtual Visits (Telemedicine).  Patients are able to view lab/test results, encounter notes, upcoming appointments, etc.  Non-urgent messages can be sent to your provider as well.   To learn more about what you can do with MyChart, go to [email or web address].    Your next appointment:   3 month(s)  Provider:   Ernest Haber

## 2023-06-07 ENCOUNTER — Ambulatory Visit
Admission: RE | Admit: 2023-06-07 | Discharge: 2023-06-07 | Disposition: A | Discharge status: Home / Self Care | Payer: Medicare Other | Source: Ambulatory Visit | Attending: Family | Admitting: Family

## 2023-06-07 DIAGNOSIS — Z9189 Other specified personal risk factors, not elsewhere classified: Secondary | ICD-10-CM

## 2023-06-07 MED ORDER — GADOPICLENOL 0.5 MMOL/ML IV SOLN
9.0000 mL | Freq: Once | INTRAVENOUS | Status: AC | PRN
  Administered 2023-06-07: 9 mL via INTRAVENOUS

## 2023-06-28 ENCOUNTER — Ambulatory Visit (HOSPITAL_BASED_OUTPATIENT_CLINIC_OR_DEPARTMENT_OTHER)
Admission: RE | Admit: 2023-06-28 | Discharge: 2023-06-28 | Disposition: A | Discharge status: Home / Self Care | Payer: Medicare Other | Source: Ambulatory Visit | Attending: Cardiology | Admitting: Cardiology

## 2023-06-28 DIAGNOSIS — E785 Hyperlipidemia, unspecified: Secondary | ICD-10-CM | POA: Insufficient documentation

## 2023-06-30 ENCOUNTER — Telehealth: Payer: Self-pay | Admitting: Cardiology

## 2023-06-30 NOTE — Telephone Encounter (Signed)
Patient returned RN's call regarding results. 

## 2023-06-30 NOTE — Telephone Encounter (Signed)
Called and spoke to patient. Below message relayed. No questions at this time.   Rollene Rotunda, MD 06/29/2023 11:37 AM EST   Calcium score was 0.  I did not suspect obstructive coronary disease.  I do not think further cardiovascular testing would be suggested based on this.  This is an excellent result.  No change in therapy but I would like to hear back from her if her shortness of breath progresses.

## 2023-07-06 ENCOUNTER — Ambulatory Visit: Payer: Self-pay | Admitting: Cardiology

## 2023-07-11 ENCOUNTER — Telehealth: Payer: Self-pay | Admitting: Cardiology

## 2023-07-11 NOTE — Telephone Encounter (Signed)
Patient is returning call in regards to results. Requesting call back. 

## 2023-07-11 NOTE — Telephone Encounter (Signed)
Spoke to patient and results given per for Calcium Score. Made patient aware that Coronary calcium score of 0. Patient verbalized understanding.

## 2023-07-12 ENCOUNTER — Encounter: Payer: Self-pay | Admitting: *Deleted

## 2023-09-01 ENCOUNTER — Ambulatory Visit: Payer: Medicare Other | Admitting: Cardiology

## 2023-11-06 DIAGNOSIS — R0602 Shortness of breath: Secondary | ICD-10-CM | POA: Insufficient documentation

## 2023-11-06 NOTE — Progress Notes (Unsigned)
  Cardiology Office Note:   Date:  11/08/2023  ID:  Allison Dudley, Allison Dudley [birth year], MRN [id number] PCP: Kaleen Mask, MD  [facility] Providers Cardiologist:  None {  History of Present Illness:   Allison Dudley is a 73 y.o. female who I saw for evaluation of multiple cardiovascular risk factors.   I sent her for a calcium score which was 0.  She was to get a lipid profile and LP(a) but she did not have this drawn yet.  She was short of breath when I saw her previously but she thinks this is improved.  She has lost 5 pounds.  She is continuing to do her walking including her hiking club. The patient denies any new symptoms such as chest discomfort, neck or arm discomfort. There has been no new shortness of breath, PND or orthopnea. There have been no reported palpitations, presyncope or syncope.    ROS: As stated in the HPI and negative for all other systems.  Studies Reviewed:    EKG:   NA  Risk Assessment/Calculations:      Physical Exam:   VS:  BP (!) 146/84 (BP Location: Left Arm, Patient Position: Sitting, Cuff Size: Normal)   Pulse 72   Ht 5\' 5"  (1.651 m)   Wt 173 lb (78.5 kg)   SpO2 97%   BMI 28.79 kg/m    Wt Readings from Last 3 Encounters:  11/08/23 173 lb (78.5 kg)  05/31/23 182 lb (82.6 kg)  10/14/16 185 lb (83.9 kg)     GEN: Well nourished, well developed in no acute distress NECK: No JVD; No carotid bruits CARDIAC: RRR, no murmurs, rubs, gallops RESPIRATORY:  Clear to auscultation without rales, wheezing or rhonchi  ABDOMEN: Soft, non-tender, non-distended EXTREMITIES:  No edema; No deformity   ASSESSMENT AND PLAN:   HTN: Her blood pressure is not at target.  She has a little bit of a dry cough with the initiation of Altace.  I am going to switch her to Cozaar at 50 mg.  She is going to send me a blood pressure diary.   Dyslipidemia: I have given her written instructions for the LP(a) and cholesterol and she can  have this done by her primary provider.  I would be happy to review this.  Shortness of breath: This is improved.  No further workup is suggested.  She will let me know if there is any issues.  Follow up with me as needed.    Signed, Rollene Rotunda, MD

## 2023-11-08 ENCOUNTER — Ambulatory Visit: Payer: Medicare Other | Attending: Cardiology | Admitting: Cardiology

## 2023-11-08 ENCOUNTER — Encounter: Payer: Self-pay | Admitting: Cardiology

## 2023-11-08 VITALS — BP 146/84 | HR 72 | Ht 65.0 in | Wt 173.0 lb

## 2023-11-08 DIAGNOSIS — E785 Hyperlipidemia, unspecified: Secondary | ICD-10-CM

## 2023-11-08 DIAGNOSIS — R0602 Shortness of breath: Secondary | ICD-10-CM

## 2023-11-08 DIAGNOSIS — I10 Essential (primary) hypertension: Secondary | ICD-10-CM

## 2023-11-08 MED ORDER — LOSARTAN POTASSIUM 50 MG PO TABS: 50.0000 mg | ORAL_TABLET | Freq: Every day | ORAL | 3 refills | Status: AC

## 2023-11-08 NOTE — Patient Instructions (Signed)
 Medication Instructions:  Stop taking Altace. Start Cozaar 50 mg daily by mouth. New script sent. *If you need a refill on your cardiac medications before your next appointment, please call your pharmacy*  Lab Work: Ask PCP to include a Lpa and fasting Lipid to your next blood draw there.  If you have labs (blood work) drawn today and your tests are completely normal, you will receive your results only by: MyChart Message (if you have MyChart) OR A paper copy in the mail If you have any lab test that is abnormal or we need to change your treatment, we will call you to review the results.   Follow-Up: At [facility], you and your health needs are our priority.  As part of our continuing mission to provide you with exceptional heart care, our providers are all part of one team.  This team includes your primary Cardiologist (physician) and Advanced Practice Providers or APPs (Physician Assistants and Nurse Practitioners) who all work together to provide you with the care you need, when you need it.  Your next appointment:    As needed.   Provider:   Rollene Rotunda, MD     We recommend signing up for the patient portal called "MyChart".  Sign up information is provided on this After Visit Summary.  MyChart is used to connect with patients for Virtual Visits (Telemedicine).  Patients are able to view lab/test results, encounter notes, upcoming appointments, etc.  Non-urgent messages can be sent to your provider as well.   To learn more about what you can do with MyChart, go to [email or web address].   Other Instructions Please take and record blood pressure 3-4 times per week and record on the blood pressure log given by the office nurse. Please submit to office for review by MD once completed      1st Floor: - Lobby - Registration  - Pharmacy  - Lab - Cafe  2nd Floor: - PV Lab - Diagnostic Testing (echo, CT, nuclear med)  3rd Floor: - Vacant  4th Floor: - TCTS  (cardiothoracic surgery) - AFib Clinic - Structural Heart Clinic - Vascular Surgery  - Vascular Ultrasound  5th Floor: - HeartCare Cardiology (general and EP) - Clinical Pharmacy for coumadin, hypertension, lipid, weight-loss medications, and med management appointments    Valet parking services will be available as well.
